# Patient Record
Sex: Female | Born: 1940 | Race: Black or African American | Hispanic: No | Smoking: Former smoker
Health system: Southern US, Community
[De-identification: ages and names within clinical notes are randomized; demographics above are authoritative.]

## PROBLEM LIST (undated history)

## (undated) DIAGNOSIS — H539 Unspecified visual disturbance: Secondary | ICD-10-CM

## (undated) DIAGNOSIS — R413 Other amnesia: Secondary | ICD-10-CM

## (undated) DIAGNOSIS — I2510 Atherosclerotic heart disease of native coronary artery without angina pectoris: Secondary | ICD-10-CM

## (undated) HISTORY — PX: VESICOVAGINAL FISTULA CLOSURE W/ TAH: SUR271

## (undated) HISTORY — DX: Unspecified visual disturbance: H53.9

## (undated) HISTORY — DX: Other amnesia: R41.3

## (undated) HISTORY — DX: Atherosclerotic heart disease of native coronary artery without angina pectoris: I25.10

---

## 1998-12-10 ENCOUNTER — Ambulatory Visit (HOSPITAL_COMMUNITY): Admission: RE | Admit: 1998-12-10 | Discharge: 1998-12-10 | Payer: Self-pay

## 2000-09-04 ENCOUNTER — Encounter (HOSPITAL_COMMUNITY): Admission: RE | Admit: 2000-09-04 | Discharge: 2000-12-03 | Payer: Self-pay | Admitting: Family Medicine

## 2001-04-12 ENCOUNTER — Ambulatory Visit (HOSPITAL_COMMUNITY): Admission: RE | Admit: 2001-04-12 | Discharge: 2001-04-12 | Payer: Self-pay | Admitting: Family Medicine

## 2001-04-12 ENCOUNTER — Encounter: Payer: Self-pay | Admitting: Family Medicine

## 2001-06-11 ENCOUNTER — Encounter: Payer: Self-pay | Admitting: Family Medicine

## 2001-06-11 ENCOUNTER — Ambulatory Visit (HOSPITAL_COMMUNITY): Admission: RE | Admit: 2001-06-11 | Discharge: 2001-06-11 | Payer: Self-pay | Admitting: Family Medicine

## 2003-01-05 ENCOUNTER — Ambulatory Visit (HOSPITAL_COMMUNITY): Admission: RE | Admit: 2003-01-05 | Discharge: 2003-01-05 | Payer: Self-pay | Admitting: Family Medicine

## 2004-01-26 ENCOUNTER — Ambulatory Visit: Payer: Self-pay | Admitting: *Deleted

## 2004-07-19 ENCOUNTER — Ambulatory Visit: Payer: Self-pay | Admitting: Family Medicine

## 2004-07-27 ENCOUNTER — Ambulatory Visit (HOSPITAL_COMMUNITY): Admission: RE | Admit: 2004-07-27 | Discharge: 2004-07-27 | Payer: Self-pay | Admitting: Family Medicine

## 2004-11-30 ENCOUNTER — Ambulatory Visit: Payer: Self-pay | Admitting: Family Medicine

## 2004-12-01 ENCOUNTER — Ambulatory Visit: Payer: Self-pay | Admitting: Family Medicine

## 2004-12-02 ENCOUNTER — Ambulatory Visit (HOSPITAL_COMMUNITY): Admission: RE | Admit: 2004-12-02 | Discharge: 2004-12-02 | Payer: Self-pay | Admitting: Family Medicine

## 2005-04-14 ENCOUNTER — Ambulatory Visit: Payer: Self-pay | Admitting: Nurse Practitioner

## 2005-05-15 ENCOUNTER — Ambulatory Visit: Payer: Self-pay | Admitting: Family Medicine

## 2005-11-03 ENCOUNTER — Ambulatory Visit: Payer: Self-pay | Admitting: Family Medicine

## 2005-12-04 ENCOUNTER — Ambulatory Visit: Payer: Self-pay | Admitting: Family Medicine

## 2007-08-26 ENCOUNTER — Emergency Department (HOSPITAL_COMMUNITY): Admission: EM | Admit: 2007-08-26 | Discharge: 2007-08-26 | Payer: Self-pay | Admitting: Emergency Medicine

## 2007-08-29 ENCOUNTER — Emergency Department (HOSPITAL_COMMUNITY): Admission: EM | Admit: 2007-08-29 | Discharge: 2007-08-29 | Payer: Self-pay | Admitting: Emergency Medicine

## 2009-06-11 ENCOUNTER — Inpatient Hospital Stay (HOSPITAL_COMMUNITY): Admission: EM | Admit: 2009-06-11 | Discharge: 2009-06-18 | Payer: Self-pay | Admitting: Emergency Medicine

## 2009-10-12 ENCOUNTER — Inpatient Hospital Stay (HOSPITAL_COMMUNITY): Admission: EM | Admit: 2009-10-12 | Discharge: 2009-10-14 | Payer: Self-pay | Admitting: Emergency Medicine

## 2010-06-04 LAB — CBC
HCT: 42.5 % (ref 36.0–46.0)
Hemoglobin: 10.8 g/dL — ABNORMAL LOW (ref 12.0–15.0)
Hemoglobin: 14.3 g/dL (ref 12.0–15.0)
MCH: 30.3 pg (ref 26.0–34.0)
MCH: 30.3 pg (ref 26.0–34.0)
MCHC: 32.9 g/dL (ref 30.0–36.0)
MCHC: 33.7 g/dL (ref 30.0–36.0)
MCV: 90.1 fL (ref 78.0–100.0)
Platelets: 325 10*3/uL (ref 150–400)
RBC: 3.47 MIL/uL — ABNORMAL LOW (ref 3.87–5.11)
RDW: 17.9 % — ABNORMAL HIGH (ref 11.5–15.5)
RDW: 17.9 % — ABNORMAL HIGH (ref 11.5–15.5)
RDW: 18.1 % — ABNORMAL HIGH (ref 11.5–15.5)

## 2010-06-04 LAB — BASIC METABOLIC PANEL
Chloride: 100 mEq/L (ref 96–112)
Creatinine, Ser: 1.36 mg/dL — ABNORMAL HIGH (ref 0.4–1.2)
GFR calc Af Amer: 47 mL/min — ABNORMAL LOW (ref >60)
Potassium: 4.1 mEq/L (ref 3.5–5.1)
Sodium: 132 mEq/L — ABNORMAL LOW (ref 135–145)

## 2010-06-04 LAB — URINALYSIS, ROUTINE W REFLEX MICROSCOPIC
Glucose, UA: NEGATIVE mg/dL
Hgb urine dipstick: NEGATIVE
Protein, ur: 30 mg/dL — AB
Urobilinogen, UA: 0.2 mg/dL (ref 0.0–1.0)
pH: 5 (ref 5.0–8.0)

## 2010-06-04 LAB — DIFFERENTIAL
Basophils Absolute: 0 10*3/uL (ref 0.0–0.1)
Blasts: 0 %
Eosinophils Absolute: 0 10*3/uL (ref 0.0–0.7)
Eosinophils Relative: 0 % (ref 0–5)
Lymphocytes Relative: 16 % (ref 12–46)
Lymphocytes Relative: 56 % — ABNORMAL HIGH (ref 12–46)
Lymphs Abs: 3.8 10*3/uL (ref 0.7–4.0)
Metamyelocytes Relative: 0 %
Monocytes Absolute: 0.5 10*3/uL (ref 0.1–1.0)
Monocytes Relative: 4 % (ref 3–12)
Monocytes Relative: 7 % (ref 3–12)
nRBC: 0 /100 WBC

## 2010-06-04 LAB — URINE MICROSCOPIC-ADD ON

## 2010-06-04 LAB — COMPREHENSIVE METABOLIC PANEL
ALT: 23 U/L (ref 0–35)
AST: 20 U/L (ref 0–37)
AST: 24 U/L (ref 0–37)
Albumin: 2.4 g/dL — ABNORMAL LOW (ref 3.5–5.2)
BUN: 17 mg/dL (ref 6–23)
Calcium: 8.3 mg/dL — ABNORMAL LOW (ref 8.4–10.5)
Calcium: 8.6 mg/dL (ref 8.4–10.5)
Chloride: 111 mEq/L (ref 96–112)
Creatinine, Ser: 0.64 mg/dL (ref 0.4–1.2)
Creatinine, Ser: 0.91 mg/dL (ref 0.4–1.2)
GFR calc Af Amer: >60 mL/min (ref >60)
Glucose, Bld: 60 mg/dL — ABNORMAL LOW (ref 70–99)
Glucose, Bld: 67 mg/dL — ABNORMAL LOW (ref 70–99)
Sodium: 140 mEq/L (ref 135–145)
Total Protein: 4.7 g/dL — ABNORMAL LOW (ref 6.0–8.3)

## 2010-06-04 LAB — TSH: TSH: 0.451 u[IU]/mL (ref 0.350–4.500)

## 2010-06-04 LAB — URINE CULTURE

## 2010-06-04 LAB — FERRITIN: Ferritin: 76 ng/mL (ref 10–291)

## 2010-06-04 LAB — POCT CARDIAC MARKERS
Myoglobin, poc: 152 ng/mL (ref 12–200)
Troponin i, poc: <0.05 ng/mL (ref 0.00–0.09)

## 2010-06-04 LAB — GLUCOSE, CAPILLARY: Glucose-Capillary: 82 mg/dL (ref 70–99)

## 2010-06-04 LAB — PREALBUMIN: Prealbumin: 28.7 mg/dL (ref 18.0–45.0)

## 2010-06-08 LAB — BASIC METABOLIC PANEL
BUN: 3 mg/dL — ABNORMAL LOW (ref 6–23)
CO2: 23 mEq/L (ref 19–32)
Calcium: 9.2 mg/dL (ref 8.4–10.5)
Chloride: 112 mEq/L (ref 96–112)
Creatinine, Ser: 0.63 mg/dL (ref 0.4–1.2)
GFR calc Af Amer: >60 mL/min (ref >60)
Glucose, Bld: 115 mg/dL — ABNORMAL HIGH (ref 70–99)

## 2010-06-13 LAB — CBC
HCT: 48.5 % — ABNORMAL HIGH (ref 36.0–46.0)
Hemoglobin: 11.4 g/dL — ABNORMAL LOW (ref 12.0–15.0)
MCHC: 33.4 g/dL (ref 30.0–36.0)
MCHC: 33.7 g/dL (ref 30.0–36.0)
MCHC: 33.8 g/dL (ref 30.0–36.0)
MCHC: 34.2 g/dL (ref 30.0–36.0)
MCV: 89.3 fL (ref 78.0–100.0)
MCV: 90.2 fL (ref 78.0–100.0)
MCV: 91.1 fL (ref 78.0–100.0)
Platelets: 219 10*3/uL (ref 150–400)
Platelets: 240 10*3/uL (ref 150–400)
Platelets: 287 10*3/uL (ref 150–400)
Platelets: 364 10*3/uL (ref 150–400)
RBC: 3.36 MIL/uL — ABNORMAL LOW (ref 3.87–5.11)
RBC: 3.48 MIL/uL — ABNORMAL LOW (ref 3.87–5.11)
RBC: 3.74 MIL/uL — ABNORMAL LOW (ref 3.87–5.11)
RDW: 15.9 % — ABNORMAL HIGH (ref 11.5–15.5)
RDW: 16 % — ABNORMAL HIGH (ref 11.5–15.5)
RDW: 16.1 % — ABNORMAL HIGH (ref 11.5–15.5)
WBC: 11.8 10*3/uL — ABNORMAL HIGH (ref 4.0–10.5)
WBC: 8.5 10*3/uL (ref 4.0–10.5)
WBC: 9.7 10*3/uL (ref 4.0–10.5)

## 2010-06-13 LAB — COMPREHENSIVE METABOLIC PANEL
ALT: 28 U/L (ref 0–35)
AST: 22 U/L (ref 0–37)
AST: 27 U/L (ref 0–37)
Albumin: 3.9 g/dL (ref 3.5–5.2)
Alkaline Phosphatase: 53 U/L (ref 39–117)
BUN: 68 mg/dL — ABNORMAL HIGH (ref 6–23)
CO2: 25 mEq/L (ref 19–32)
Calcium: 11.6 mg/dL — ABNORMAL HIGH (ref 8.4–10.5)
Calcium: 9.1 mg/dL (ref 8.4–10.5)
Chloride: 102 mEq/L (ref 96–112)
Chloride: 110 mEq/L (ref 96–112)
Creatinine, Ser: 1.69 mg/dL — ABNORMAL HIGH (ref 0.4–1.2)
GFR calc Af Amer: 36 mL/min — ABNORMAL LOW (ref >60)
GFR calc Af Amer: >60 mL/min (ref >60)
GFR calc non Af Amer: >60 mL/min (ref >60)
Glucose, Bld: 100 mg/dL — ABNORMAL HIGH (ref 70–99)
Sodium: 142 mEq/L (ref 135–145)
Total Bilirubin: 0.6 mg/dL (ref 0.3–1.2)
Total Bilirubin: 0.6 mg/dL (ref 0.3–1.2)

## 2010-06-13 LAB — PROTEIN ELECTROPH W RFLX QUANT IMMUNOGLOBULINS
Alpha-2-Globulin: 11.1 % (ref 7.1–11.8)
Gamma Globulin: 14.2 % (ref 11.1–18.8)
M-Spike, %: NOT DETECTED g/dL

## 2010-06-13 LAB — PHOSPHORUS: Phosphorus: 2 mg/dL — ABNORMAL LOW (ref 2.3–4.6)

## 2010-06-13 LAB — POCT I-STAT 3, ART BLOOD GAS (G3+)
Acid-base deficit: 9 mmol/L — ABNORMAL HIGH (ref 0.0–2.0)
O2 Saturation: 98 %
Patient temperature: 37
pH, Arterial: 7.346 — ABNORMAL LOW (ref 7.350–7.400)

## 2010-06-13 LAB — POCT CARDIAC MARKERS
CKMB, poc: 3.1 ng/mL (ref 1.0–8.0)
Troponin i, poc: <0.05 ng/mL (ref 0.00–0.09)

## 2010-06-13 LAB — BASIC METABOLIC PANEL
BUN: 19 mg/dL (ref 6–23)
BUN: 49 mg/dL — ABNORMAL HIGH (ref 6–23)
CO2: 16 mEq/L — ABNORMAL LOW (ref 19–32)
CO2: 17 mEq/L — ABNORMAL LOW (ref 19–32)
Calcium: 8.7 mg/dL (ref 8.4–10.5)
Calcium: 9.1 mg/dL (ref 8.4–10.5)
Calcium: 9.7 mg/dL (ref 8.4–10.5)
Creatinine, Ser: 0.6 mg/dL (ref 0.4–1.2)
Creatinine, Ser: 0.87 mg/dL (ref 0.4–1.2)
Creatinine, Ser: 1.2 mg/dL (ref 0.4–1.2)
GFR calc Af Amer: 54 mL/min — ABNORMAL LOW (ref >60)
GFR calc Af Amer: >60 mL/min (ref >60)
GFR calc Af Amer: >60 mL/min (ref >60)
GFR calc non Af Amer: >60 mL/min (ref >60)
Glucose, Bld: 101 mg/dL — ABNORMAL HIGH (ref 70–99)
Glucose, Bld: 81 mg/dL (ref 70–99)

## 2010-06-13 LAB — VITAMIN B12
Vitamin B-12: 707 pg/mL (ref 211–911)
Vitamin B-12: 777 pg/mL (ref 211–911)

## 2010-06-13 LAB — CULTURE, BLOOD (ROUTINE X 2)

## 2010-06-13 LAB — DIFFERENTIAL
Basophils Absolute: 0 10*3/uL (ref 0.0–0.1)
Basophils Absolute: 0 10*3/uL (ref 0.0–0.1)
Eosinophils Absolute: 0.2 10*3/uL (ref 0.0–0.7)
Eosinophils Relative: 0 % (ref 0–5)
Lymphocytes Relative: 33 % (ref 12–46)
Lymphocytes Relative: 35 % (ref 12–46)
Lymphs Abs: 3 10*3/uL (ref 0.7–4.0)
Monocytes Absolute: 0.7 10*3/uL (ref 0.1–1.0)
Monocytes Absolute: 1.2 10*3/uL — ABNORMAL HIGH (ref 0.1–1.0)
Neutro Abs: 4.8 10*3/uL (ref 1.7–7.7)
Neutrophils Relative %: 55 % (ref 43–77)

## 2010-06-13 LAB — RPR: RPR Ser Ql: NONREACTIVE

## 2010-06-13 LAB — FOLATE RBC: RBC Folate: 334 ng/mL (ref 180–600)

## 2010-06-13 LAB — URINALYSIS, ROUTINE W REFLEX MICROSCOPIC
Hgb urine dipstick: NEGATIVE
Protein, ur: NEGATIVE mg/dL
Urobilinogen, UA: 0.2 mg/dL (ref 0.0–1.0)

## 2010-06-13 LAB — PTH, INTACT AND CALCIUM: Calcium, Total (PTH): 10.5 mg/dL (ref 8.4–10.5)

## 2010-06-13 LAB — HEMOCCULT GUIAC POC 1CARD (OFFICE): Fecal Occult Bld: NEGATIVE

## 2010-06-13 LAB — TROPONIN I: Troponin I: 0.01 ng/mL (ref 0.00–0.06)

## 2010-06-13 LAB — IRON AND TIBC
Iron: 61 ug/dL (ref 42–135)
Saturation Ratios: 35 % (ref 20–55)
UIBC: 111 ug/dL

## 2010-06-13 LAB — RETICULOCYTES: RBC.: 3.32 MIL/uL — ABNORMAL LOW (ref 3.87–5.11)

## 2010-06-13 LAB — AMMONIA: Ammonia: 33 umol/L (ref 11–35)

## 2010-06-13 LAB — CK TOTAL AND CKMB (NOT AT ARMC): Total CK: 35 U/L (ref 7–177)

## 2010-12-15 LAB — CBC
HCT: 44.8
Hemoglobin: 15
MCHC: 33.6
MCV: 87.5
Platelets: 266
RBC: 5.12 — ABNORMAL HIGH
RDW: 14.1
WBC: 6.3

## 2010-12-15 LAB — DIFFERENTIAL
Basophils Absolute: 0
Basophils Relative: 0
Eosinophils Absolute: 0.1
Eosinophils Relative: 1
Lymphocytes Relative: 27
Lymphs Abs: 1.7
Monocytes Absolute: 0.5
Monocytes Relative: 7
Neutro Abs: 4
Neutrophils Relative %: 64

## 2010-12-15 LAB — BASIC METABOLIC PANEL
BUN: 10
Calcium: 9.6
Creatinine, Ser: 1.14
GFR calc non Af Amer: 48 — ABNORMAL LOW
Glucose, Bld: 86
Potassium: 4.5

## 2010-12-15 LAB — BASIC METABOLIC PANEL WITH GFR
CO2: 27
Chloride: 104
GFR calc Af Amer: 58 — ABNORMAL LOW
Sodium: 139

## 2014-01-17 ENCOUNTER — Emergency Department (HOSPITAL_COMMUNITY): Payer: Medicare Other

## 2014-01-17 ENCOUNTER — Encounter (HOSPITAL_COMMUNITY): Payer: Self-pay | Admitting: Emergency Medicine

## 2014-01-17 ENCOUNTER — Emergency Department (HOSPITAL_COMMUNITY)
Admission: EM | Admit: 2014-01-17 | Discharge: 2014-01-17 | Disposition: A | Discharge status: Home / Self Care | Payer: Medicare Other | Attending: Emergency Medicine | Admitting: Emergency Medicine

## 2014-01-17 DIAGNOSIS — Z87891 Personal history of nicotine dependence: Secondary | ICD-10-CM | POA: Insufficient documentation

## 2014-01-17 DIAGNOSIS — R1084 Generalized abdominal pain: Secondary | ICD-10-CM | POA: Diagnosis not present

## 2014-01-17 DIAGNOSIS — K7689 Other specified diseases of liver: Secondary | ICD-10-CM | POA: Diagnosis not present

## 2014-01-17 DIAGNOSIS — R1033 Periumbilical pain: Secondary | ICD-10-CM | POA: Diagnosis not present

## 2014-01-17 DIAGNOSIS — F0390 Unspecified dementia without behavioral disturbance: Secondary | ICD-10-CM | POA: Diagnosis not present

## 2014-01-17 DIAGNOSIS — R51 Headache: Secondary | ICD-10-CM | POA: Insufficient documentation

## 2014-01-17 DIAGNOSIS — E860 Dehydration: Secondary | ICD-10-CM | POA: Diagnosis not present

## 2014-01-17 DIAGNOSIS — R519 Headache, unspecified: Secondary | ICD-10-CM

## 2014-01-17 DIAGNOSIS — R1030 Lower abdominal pain, unspecified: Secondary | ICD-10-CM | POA: Diagnosis not present

## 2014-01-17 LAB — CBC WITH DIFFERENTIAL/PLATELET
BASOS PCT: 0 % (ref 0–1)
Basophils Absolute: 0 10*3/uL (ref 0.0–0.1)
EOS ABS: 0 10*3/uL (ref 0.0–0.7)
Eosinophils Relative: 1 % (ref 0–5)
HEMATOCRIT: 32.1 % — AB (ref 36.0–46.0)
Hemoglobin: 10.8 g/dL — ABNORMAL LOW (ref 12.0–15.0)
Lymphocytes Relative: 31 % (ref 12–46)
Lymphs Abs: 1.7 10*3/uL (ref 0.7–4.0)
MCH: 32 pg (ref 26.0–34.0)
MCHC: 33.6 g/dL (ref 30.0–36.0)
MCV: 95.3 fL (ref 78.0–100.0)
MONO ABS: 0.7 10*3/uL (ref 0.1–1.0)
MONOS PCT: 14 % — AB (ref 3–12)
Neutro Abs: 3 10*3/uL (ref 1.7–7.7)
Neutrophils Relative %: 54 % (ref 43–77)
Platelets: 416 10*3/uL — ABNORMAL HIGH (ref 150–400)
RBC: 3.37 MIL/uL — ABNORMAL LOW (ref 3.87–5.11)
RDW: 19.1 % — ABNORMAL HIGH (ref 11.5–15.5)
WBC: 5.5 10*3/uL (ref 4.0–10.5)

## 2014-01-17 LAB — URINALYSIS, ROUTINE W REFLEX MICROSCOPIC
Glucose, UA: NEGATIVE mg/dL
Hgb urine dipstick: NEGATIVE
Ketones, ur: >80 mg/dL — AB
Leukocytes, UA: NEGATIVE
Nitrite: NEGATIVE
Protein, ur: 100 mg/dL — AB
SPECIFIC GRAVITY, URINE: 1.022 (ref 1.005–1.030)
Urobilinogen, UA: 1 mg/dL (ref 0.0–1.0)
pH: 6 (ref 5.0–8.0)

## 2014-01-17 LAB — URINE MICROSCOPIC-ADD ON

## 2014-01-17 LAB — COMPREHENSIVE METABOLIC PANEL
ALT: 10 U/L (ref 0–35)
ANION GAP: 21 — AB (ref 5–15)
AST: 29 U/L (ref 0–37)
Albumin: 3.7 g/dL (ref 3.5–5.2)
Alkaline Phosphatase: 49 U/L (ref 39–117)
BILIRUBIN TOTAL: 0.4 mg/dL (ref 0.3–1.2)
BUN: 13 mg/dL (ref 6–23)
CO2: 21 mEq/L (ref 19–32)
CREATININE: 0.71 mg/dL (ref 0.50–1.10)
Calcium: 9.9 mg/dL (ref 8.4–10.5)
Chloride: 101 mEq/L (ref 96–112)
GFR calc Af Amer: >90 mL/min (ref >90)
GFR calc non Af Amer: 84 mL/min — ABNORMAL LOW (ref >90)
Glucose, Bld: 83 mg/dL (ref 70–99)
Potassium: 3.1 mEq/L — ABNORMAL LOW (ref 3.7–5.3)
Sodium: 143 mEq/L (ref 137–147)
TOTAL PROTEIN: 6.9 g/dL (ref 6.0–8.3)

## 2014-01-17 LAB — LIPASE, BLOOD: LIPASE: 39 U/L (ref 11–59)

## 2014-01-17 MED ORDER — SODIUM CHLORIDE 0.9 % IV BOLUS (SEPSIS)
500.0000 mL | Freq: Once | INTRAVENOUS | Status: AC
  Administered 2014-01-17: 500 mL via INTRAVENOUS

## 2014-01-17 MED ORDER — IOHEXOL 300 MG/ML  SOLN
100.0000 mL | Freq: Once | INTRAMUSCULAR | Status: AC | PRN
  Administered 2014-01-17: 100 mL via INTRAVENOUS

## 2014-01-17 MED ORDER — ACETAMINOPHEN 325 MG PO TABS
650.0000 mg | ORAL_TABLET | Freq: Once | ORAL | Status: AC
  Administered 2014-01-17: 650 mg via ORAL
  Filled 2014-01-17: qty 2

## 2014-01-17 MED ORDER — MORPHINE SULFATE 4 MG/ML IJ SOLN
4.0000 mg | Freq: Once | INTRAMUSCULAR | Status: AC
  Administered 2014-01-17: 4 mg via INTRAVENOUS
  Filled 2014-01-17: qty 1

## 2014-01-17 MED ORDER — TRAMADOL HCL 50 MG PO TABS: 50.0000 mg | ORAL_TABLET | Freq: Four times a day (QID) | ORAL | Status: DC | PRN

## 2014-01-17 MED ORDER — GI COCKTAIL ~~LOC~~
30.0000 mL | Freq: Once | ORAL | Status: AC
  Administered 2014-01-17: 30 mL via ORAL
  Filled 2014-01-17: qty 30

## 2014-01-17 MED ORDER — IOHEXOL 300 MG/ML  SOLN
50.0000 mL | Freq: Once | INTRAMUSCULAR | Status: AC | PRN
  Administered 2014-01-17: 50 mL via ORAL

## 2014-01-17 MED ORDER — SODIUM CHLORIDE 0.9 % IV BOLUS (SEPSIS)
1000.0000 mL | INTRAVENOUS | Status: AC
  Administered 2014-01-17: 1000 mL via INTRAVENOUS

## 2014-01-17 MED ORDER — FENTANYL CITRATE 0.05 MG/ML IJ SOLN
50.0000 ug | Freq: Once | INTRAMUSCULAR | Status: AC
  Administered 2014-01-17: 50 ug via INTRAVENOUS
  Filled 2014-01-17: qty 2

## 2014-01-17 NOTE — Discharge Instructions (Signed)
Abdominal Pain, Women °Abdominal (stomach, pelvic, or belly) pain can be caused by many things. It is important to tell your doctor: °· The location of the pain. °· Does it come and go or is it present all the time? °· Are there things that start the pain (eating certain foods, exercise)? °· Are there other symptoms associated with the pain (fever, nausea, vomiting, diarrhea)? °All of this is helpful to know when trying to find the cause of the pain. °CAUSES  °· Stomach: virus or bacteria infection, or ulcer. °· Intestine: appendicitis (inflamed appendix), regional ileitis (Crohn's disease), ulcerative colitis (inflamed colon), irritable bowel syndrome, diverticulitis (inflamed diverticulum of the colon), or cancer of the stomach or intestine. °· Gallbladder disease or stones in the gallbladder. °· Kidney disease, kidney stones, or infection. °· Pancreas infection or cancer. °· Fibromyalgia (pain disorder). °· Diseases of the female organs: °¨ Uterus: fibroid (non-cancerous) tumors or infection. °¨ Fallopian tubes: infection or tubal pregnancy. °¨ Ovary: cysts or tumors. °¨ Pelvic adhesions (scar tissue). °¨ Endometriosis (uterus lining tissue growing in the pelvis and on the pelvic organs). °¨ Pelvic congestion syndrome (female organs filling up with blood just before the menstrual period). °¨ Pain with the menstrual period. °¨ Pain with ovulation (producing an egg). °¨ Pain with an IUD (intrauterine device, birth control) in the uterus. °¨ Cancer of the female organs. °· Functional pain (pain not caused by a disease, may improve without treatment). °· Psychological pain. °· Depression. °DIAGNOSIS  °Your doctor will decide the seriousness of your pain by doing an examination. °· Blood tests. °· X-rays. °· Ultrasound. °· CT scan (computed tomography, special type of X-ray). °· MRI (magnetic resonance imaging). °· Cultures, for infection. °· Barium enema (dye inserted in the large intestine, to better view it with  X-rays). °· Colonoscopy (looking in intestine with a lighted tube). °· Laparoscopy (minor surgery, looking in abdomen with a lighted tube). °· Major abdominal exploratory surgery (looking in abdomen with a large incision). °TREATMENT  °The treatment will depend on the cause of the pain.  °· Many cases can be observed and treated at home. °· Over-the-counter medicines recommended by your caregiver. °· Prescription medicine. °· Antibiotics, for infection. °· Birth control pills, for painful periods or for ovulation pain. °· Hormone treatment, for endometriosis. °· Nerve blocking injections. °· Physical therapy. °· Antidepressants. °· Counseling with a psychologist or psychiatrist. °· Minor or major surgery. °HOME CARE INSTRUCTIONS  °· Do not take laxatives, unless directed by your caregiver. °· Take over-the-counter pain medicine only if ordered by your caregiver. Do not take aspirin because it can cause an upset stomach or bleeding. °· Try a clear liquid diet (broth or water) as ordered by your caregiver. Slowly move to a bland diet, as tolerated, if the pain is related to the stomach or intestine. °· Have a thermometer and take your temperature several times a day, and record it. °· Bed rest and sleep, if it helps the pain. °· Avoid sexual intercourse, if it causes pain. °· Avoid stressful situations. °· Keep your follow-up appointments and tests, as your caregiver orders. °· If the pain does not go away with medicine or surgery, you may try: °¨ Acupuncture. °¨ Relaxation exercises (yoga, meditation). °¨ Group therapy. °¨ Counseling. °SEEK MEDICAL CARE IF:  °· You notice certain foods cause stomach pain. °· Your home care treatment is not helping your pain. °· You need stronger pain medicine. °· You want your IUD removed. °· You feel faint or   lightheaded. °· You develop nausea and vomiting. °· You develop a rash. °· You are having side effects or an allergy to your medicine. °SEEK IMMEDIATE MEDICAL CARE IF:  °· Your  pain does not go away or gets worse. °· You have a fever. °· Your pain is felt only in portions of the abdomen. The right side could possibly be appendicitis. The left lower portion of the abdomen could be colitis or diverticulitis. °· You are passing blood in your stools (bright red or black tarry stools, with or without vomiting). °· You have blood in your urine. °· You develop chills, with or without a fever. °· You pass out. °MAKE SURE YOU:  °· Understand these instructions. °· Will watch your condition. °· Will get help right away if you are not doing well or get worse. °Document Released: 01/01/2007 Document Revised: 07/21/2013 Document Reviewed: 01/21/2009 °ExitCare® Patient Information ©2015 [employer]. This information is not intended to replace advice given to you by your health care provider. Make sure you discuss any questions you have with your health care provider. ° °

## 2014-01-17 NOTE — ED Notes (Signed)
Social work/case management called.

## 2014-01-17 NOTE — ED Notes (Signed)
MD Aline Brochure in to speak with family member who is Therapist, sports and also niece for clarification of discharge orders and plan of care.

## 2014-01-17 NOTE — ED Notes (Addendum)
Pt from home via [facility] c/o lower abdominal pain and headache for "a few days". Per [facility] pt does  Not see a doctor and has had significant weight loss. She has not been eating. Per EMS family is concerned for patient being able to care for herself (she lives alone). PT is tearful during assessment. She denies nausea or vomiting.

## 2014-01-17 NOTE — ED Notes (Addendum)
Pt removed 20 G IV in left AC that was placed by [place]. She was informed that she will have to have an IV for CT. Niece at bedside and reports pt pulled IV out.

## 2014-01-17 NOTE — ED Notes (Signed)
IV infiltrated. Will have ultrasound IV attempt.

## 2014-01-17 NOTE — ED Notes (Signed)
Bed: WA01 Expected date: 01/17/14 Expected time: 4:04 PM Means of arrival: Ambulance Comments: headache

## 2014-01-17 NOTE — ED Provider Notes (Signed)
CSN: [id number]     Arrival date & time 01/17/14  1609 History   First MD Initiated Contact with Patient 01/17/14 1648     Chief Complaint  Patient presents with  . Headache  . Abdominal Pain     (Consider location/radiation/quality/duration/timing/severity/associated sxs/prior Treatment) Patient is a 73 y.o. female presenting with headaches and abdominal pain. The history is provided by the patient.  Headache Pain location:  Frontal Quality:  Dull Radiates to:  Does not radiate Severity currently:  7/10 Severity at highest:  Unable to specify Onset quality:  Gradual Duration:  3 weeks Timing:  Intermittent Progression:  Waxing and waning Chronicity:  Recurrent Similar to prior headaches: yes   Context comment:  Spontaneously Relieved by:  Nothing Worsened by:  Nothing tried Ineffective treatments:  NSAIDs and aspirin Associated symptoms: abdominal pain   Associated symptoms: no back pain, no congestion, no cough, no diarrhea, no dizziness, no pain, no fatigue, no fever, no nausea, no neck pain and no vomiting   Abdominal pain:    Location:  Periumbilical   Quality:  Unable to specify   Severity:  Mild   Onset quality:  Gradual   Duration:  4 days   Timing:  Intermittent   Progression:  Worsening   Chronicity:  Recurrent Abdominal Pain Associated symptoms: no chest pain, no cough, no diarrhea, no dysuria, no fatigue, no fever, no hematuria, no nausea, no shortness of breath and no vomiting     History reviewed. No pertinent past medical history. History reviewed. No pertinent past surgical history. No family history on file. History  Substance Use Topics  . Smoking status: Former Smoker    Types: Cigarettes  . Smokeless tobacco: Not on file  . Alcohol Use: No   OB History   Grav Para Term Preterm Abortions TAB SAB Ect Mult Living                 Review of Systems  Constitutional: Negative for fever and fatigue.  HENT: Negative for congestion and drooling.    Eyes: Negative for pain.  Respiratory: Negative for cough and shortness of breath.   Cardiovascular: Negative for chest pain.  Gastrointestinal: Positive for abdominal pain. Negative for nausea, vomiting and diarrhea.  Genitourinary: Negative for dysuria and hematuria.  Musculoskeletal: Negative for back pain, gait problem and neck pain.  Skin: Negative for color change.  Neurological: Positive for headaches. Negative for dizziness.  Hematological: Negative for adenopathy.  Psychiatric/Behavioral: Negative for behavioral problems.  All other systems reviewed and are negative.     Allergies  Review of patient's allergies indicates no known allergies.  Home Medications   Prior to Admission medications   Not on File   BP 118/58  Pulse 78  Temp(Src) 98.1 F (36.7 C) (Oral)  Resp 18  SpO2 100% Physical Exam  Nursing note and vitals reviewed. Constitutional: She appears well-developed and well-nourished.  HENT:  Head: Normocephalic and atraumatic.  Mouth/Throat: Oropharynx is clear and moist. No oropharyngeal exudate.  Eyes: Conjunctivae and EOM are normal. Pupils are equal, round, and reactive to light.  Neck: Normal range of motion. Neck supple.  Cardiovascular: Normal rate, regular rhythm, normal heart sounds and intact distal pulses.  Exam reveals no gallop and no friction rub.   No murmur heard. Pulmonary/Chest: Effort normal and breath sounds normal. No respiratory distress. She has no wheezes.  Abdominal: Soft. Bowel sounds are normal. There is tenderness (mild periumbilical tenderness to palpation.). There is no rebound and no guarding.  Musculoskeletal: Normal range of motion. She exhibits no edema and no tenderness.  Neurological: She is alert.  alert, oriented x2, does not know year speech: normal in context and clarity memory: intact grossly cranial nerves II-XII: intact motor strength: full proximally and distally no involuntary movements or  tremors sensation: intact to light touch diffusely  cerebellar: finger-to-nose and heel-to-shin intact gait: normal forwards and backwards   Skin: Skin is warm and dry.  Psychiatric: She has a normal mood and affect. Her behavior is normal.    ED Course  Procedures (including critical care time) Labs Review Labs Reviewed  CBC WITH DIFFERENTIAL - Abnormal; Notable for the following:    RBC 3.37 (*)    Hemoglobin 10.8 (*)    HCT 32.1 (*)    RDW 19.1 (*)    Platelets 416 (*)    Monocytes Relative 14 (*)    All other components within normal limits  COMPREHENSIVE METABOLIC PANEL - Abnormal; Notable for the following:    Potassium 3.1 (*)    GFR calc non Af Amer 84 (*)    Anion gap 21 (*)    All other components within normal limits  URINALYSIS, ROUTINE W REFLEX MICROSCOPIC - Abnormal; Notable for the following:    APPearance CLOUDY (*)    Bilirubin Urine LARGE (*)    Ketones, ur >80 (*)    Protein, ur 100 (*)    All other components within normal limits  LIPASE, BLOOD  URINE MICROSCOPIC-ADD ON    Imaging Review Ct Head Wo Contrast  01/17/2014   CLINICAL DATA:  Headache for several days.  Dementia.  EXAM: CT HEAD WITHOUT CONTRAST  TECHNIQUE: Contiguous axial images were obtained from the base of the skull through the vertex without intravenous contrast.  COMPARISON:  October 12, 2009  FINDINGS: There is mild diffuse atrophy. There is no mass, hemorrhage, extra-axial fluid collection, or midline shift. The gray-white compartments appear normal. No acute infarct apparent. Bony calvarium appears intact. The mastoid air cells are clear.  IMPRESSION: Mild diffuse atrophy. Study otherwise unremarkable. No intracranial mass, hemorrhage, or evidence of acute infarct.   Electronically Signed   By: Lowella Grip M.D.   On: 01/17/2014 20:41   Ct Abdomen Pelvis W Contrast  01/17/2014   CLINICAL DATA:  Lower abdominal pain and headache for a few days. Weakness.  EXAM: CT ABDOMEN AND PELVIS  WITH CONTRAST  TECHNIQUE: Multidetector CT imaging of the abdomen and pelvis was performed using the standard protocol following bolus administration of intravenous contrast.  CONTRAST:  158mL OMNIPAQUE IOHEXOL 300 MG/ML SOLN, 17mL OMNIPAQUE IOHEXOL 300 MG/ML SOLN  COMPARISON:  06/11/2009  FINDINGS: Lower chest: No pleural or pericardial effusion identified. The lung bases appear clear.  Hepatobiliary: Small cyst within the left hepatic lobe is stable measuring 8 mm. No suspicious liver abnormality. The gallbladder appears normal. No biliary dilatation.  Pancreas: The pancreatic duct is increased in caliber measuring 6 mm within the tail of pancreas. This is new when compared with study from 06/11/2009. No mass identified. Pancreas divisum anatomy is suspected.  Spleen: Normal appearance of the spleen.  Adrenals/Urinary Tract: The adrenal glands are both normal 4 mm nonobstructing stone within the upper pole the right kidney is noted. The left kidney is normal. The urinary bladder appears within normal limits. The urinary bladder appears normal.  Stomach/Bowel: The stomach appears normal. The small bowel loops have a normal course and caliber. There is no obstruction.  Vascular/Lymphatic: There is calcified atherosclerotic disease  involving the abdominal aorta. No aneurysm. There is no retroperitoneal adenopathy. No mesenteric adenopathy. No pelvic or inguinal adenopathy.  Reproductive: Previous hysterectomy.  No adnexal mass noted.  Other: No free fluid or abnormal fluid collection identified within the abdomen or pelvis.  Musculoskeletal: Review of the visualized bony structures is significant for mild scoliosis and multi level degenerative disc disease. This is most advanced at the L5-S1 level.  IMPRESSION: 1. No acute findings identified within the abdomen or pelvis. 2. Since the previous exam there has been interval increase in caliber of the pancreatic duct which now measures 6 mm. Pancreas divisum anatomy is  suspected. Although no mass is identified, further evaluation with nonemergent, contrast enhanced MRCP is recommended. 3. Lumbar spondylosis.   Electronically Signed   By: Kerby Moors M.D.   On: 01/17/2014 20:58     EKG Interpretation None      MDM   Final diagnoses:  Headache  Periumbilical pain  Dehydration    5:26 PM 73 y.o. female  Who presents with intermittent headaches over the last 3 weeks and intermittent abdominal pain over the last 3-4 days. She denies any fevers, vomiting, or diarrhea. She is afebrile and vital signs are unremarkable here. Headache is consistent with previous headaches in the past. She is here with another family member. The patient lives alone. Will get screening labs and imaging. She has a normal neurologic exam.  10:20 PM: I interpreted/reviewed the labs and/or imaging. Lg ketones in the UA, also elev A gap but no acidosis. Possibly related to dehydration and poor nutrition (starvation ketosis?). I educated the pt and family about this. She got 1.5 L of IVF here and ate a sandwich. I also ordered a face to face as she lives alone and likely needs some more support at home. Care mgmt was not here to help facilitate this given that it was the weekend.  She was feeling much better and was d/c home.  I have discussed the diagnosis/risks/treatment options with the patient and family and believe the pt to be eligible for discharge home to follow-up with her pcp. We also discussed returning to the ED immediately if new or worsening sx occur. We discussed the sx which are most concerning (e.g., worsening pain, fever) that necessitate immediate return. Medications administered to the patient during their visit and any new prescriptions provided to the patient are listed below.  Medications given during this visit Medications  sodium chloride 0.9 % bolus 500 mL (0 mLs Intravenous Stopped 01/17/14 2030)  fentaNYL (SUBLIMAZE) injection 50 mcg (50 mcg Intravenous Given  01/17/14 1823)  acetaminophen (TYLENOL) tablet 650 mg (650 mg Oral Given 01/17/14 1744)  gi cocktail (Maalox,Lidocaine,Donnatal) (30 mLs Oral Given 01/17/14 1946)  morphine 4 MG/ML injection 4 mg (4 mg Intravenous Given 01/17/14 1946)  iohexol (OMNIPAQUE) 300 MG/ML solution 50 mL (50 mLs Oral Contrast Given 01/17/14 2016)  iohexol (OMNIPAQUE) 300 MG/ML solution 100 mL (100 mLs Intravenous Contrast Given 01/17/14 2016)  sodium chloride 0.9 % bolus 1,000 mL (0 mLs Intravenous Stopped 01/17/14 2213)    New Prescriptions   TRAMADOL (ULTRAM) 50 MG TABLET    Take 1 tablet (50 mg total) by mouth every 6 (six) hours as needed.     Pamella Pert, MD 01/18/14 [phone]

## 2014-01-17 NOTE — ED Notes (Signed)
Pt does not wish to try for UA.  Will try for ask about UA again.

## 2014-01-18 NOTE — Progress Notes (Signed)
CARE MANAGEMENT NOTE 01/18/2014  Patient:  Audrey Norris, Audrey Norris   Account Number:  [id number]  Date Initiated:  01/18/2014  Documentation initiated by:  [facility]  Subjective/Objective Assessment:   dehydration     Action/Plan:   lives alone   Anticipated DC Date:  01/17/2014   Anticipated DC Plan:  [place]  CM consult      Choice offered to / List presented to:             Status of service:  Completed, signed off Medicare Important Message given?   (If response is "NO", the following Medicare IM given date fields will be blank) Date Medicare IM given:   Medicare IM given by:   Date Additional Medicare IM given:   Additional Medicare IM given by:    Discharge Disposition:  [place]  Per UR Regulation:    If discussed at Long Length of Stay Meetings, dates discussed:    Comments:  01/18/2014 0920 Attempted call to number listed # [phone] number not accepting any calls at this time. HH orders were placed in system. Pt dc home 01/17/2014 at 11:14 pm from ED. Jonnie Finner RN CCM Case Mgmt phone [phone]

## 2014-01-18 NOTE — Progress Notes (Signed)
CARE MANAGEMENT NOTE 01/18/2014  Patient:  Audrey Norris, Audrey Norris   Account Number:  [id number]  Date Initiated:  01/18/2014  Documentation initiated by:  [facility]  Subjective/Objective Assessment:   dehydration     Action/Plan:   lives alone   Anticipated DC Date:  01/17/2014   Anticipated DC Plan:  [place]  CM consult      Choice offered to / List presented to:             Status of service:  Completed, signed off Medicare Important Message given?   (If response is "NO", the following Medicare IM given date fields will be blank) Date Medicare IM given:   Medicare IM given by:   Date Additional Medicare IM given:   Additional Medicare IM given by:    Discharge Disposition:  [place]  Per UR Regulation:    If discussed at Long Length of Stay Meetings, dates discussed:    Comments:  01/18/2014 1445 Attempted call to number and not accepting calls. No emergency contact number to call. Unable to arrange [facility]. Jonnie Finner RN CCM Case Mgmt phone [phone]  01/18/2014 0920 Attempted call to number listed # [phone] number not accepting any calls at this time. HH orders were placed in system. Pt dc home 01/17/2014 at 11:14 pm from ED. Jonnie Finner RN CCM Case Mgmt phone [phone]

## 2014-01-19 NOTE — Progress Notes (Signed)
CARE MANAGEMENT Audrey NOTE 01/19/2014  Patient:  Audrey Norris, Audrey Norris   Account Number:  [id number]  Date Initiated:  01/19/2014  Documentation initiated by:  Audrey Norris  Subjective/Objective Assessment:   73 yr old medicare [place] pt seen in [place] Audrey for Headache and abdominal pain on 01/17/14 found to have dehydration and poor nutrition     Subjective/Objective Assessment Detail:   Audrey Norris pt's niece [phone] cell # & [phone] home #    In [facility] 01/17/14 Dehydration requested more support at home    Lives alone but has support from [place]     Action/Plan:   Audrey CM received a voice message left by Audrey Norris, pt's niece CM returned a call to [place] at [phone] cell # and left a voice message to include [facility] & CM office numbers   Action/Plan Detail:   CM requested a call from niece if home health staff has not arrived   Anticipated DC Date:  01/17/2014     Status Recommendation to Physician:   Result of Recommendation:    Other Audrey Norris  Other  Outpatient Services - Pt will follow up   [place]   Choice offered to / List presented to:  C-2 HC POA / Guardian     HH arranged  HH-1 RN  [place]      [place].    Status of service:  Completed, signed off  Audrey Comments:   Audrey Comments Detail:  01/19/14 1659 updated Audrey Norris on calls to [facility] and [facility] with responses- pt can be seen by [facility] with medicare part A, need for pt to get to [facility] to be seen so further orders can be provided & neurology referral for memory issues can be made. Audrey Norris states [facility] has already called.  Provided Audrey Norris number for [employer]. Encouraged Audrey Norris to inquire about HHSW from [facility] once medical care re established to help with medicaid application, meals on wheels,  community resources, placement etc.  Audrey Norris very appreciative of  services rendered 1622 Audrey Cm called [facility] and [place] Staff reports pt was a "No show for her last two appointments"  last 11/25/13 & 12/25/13 and has not been seen " in last year and two months" " She would have to be seen here" per Joellen Jersey for Home health orders to be signed off Joellen Jersey confirmed pt noted to have member issues in [place] chart (possible reason no show for appointments) 1521 Return call from [place] of [facility] Referral for care provided Reports pt with part A is able to have home health Request calls to Niece not pt 85 Left voice message fro Cyril Mourning of [facility] to provide referral for home health services Request calls to Niece at [phone] cell # & [phone] home # vs pt (who per niece has "not documented dementia" and behavioral issues 1500 Cm called Niece on her home number [phone] after not receiving a return call from her Nieces clarifies with CM pt concerns Reports pt d/c without home health services after 5 pm on 01/17/14 While speaking with Niece CM noted 2 attempts in EPIC by Weekend CM to contact pt at her home number listed as [phone] on 01/18/14 without success.  CM discussed this with Audrey Norris who  confirms there is not an operating number for pt and she had asked [facility] nursing staff to call her at either of her numbers and to remove this incorrect number for pt CM  to removed incorrect number in EPIC and added niece as contact person for pt Discussed DSS, APS with Audrey Norris who reports pt is no show for appointments and has walked out of appointments with DSS with Audrey Norris present.  Audrey Norris reports APS called x 2 for poor living conditions but pt found competent Discussed importance of medicare part B & D for pt Audrey Norris states pt reports calling medicare to have funds from being removed for Medicare part B and pt does not have medicaid because pt walked out of DSS stating she did not need services Discussed possible out of pocket expense for home health and other services  Audrey Norris reports previously working at [employer]

## 2014-01-20 DIAGNOSIS — F0390 Unspecified dementia without behavioral disturbance: Secondary | ICD-10-CM | POA: Diagnosis not present

## 2014-01-20 DIAGNOSIS — R627 Adult failure to thrive: Secondary | ICD-10-CM | POA: Diagnosis not present

## 2014-01-20 DIAGNOSIS — E46 Unspecified protein-calorie malnutrition: Secondary | ICD-10-CM | POA: Diagnosis not present

## 2014-01-20 DIAGNOSIS — R634 Abnormal weight loss: Secondary | ICD-10-CM | POA: Diagnosis not present

## 2014-01-22 DIAGNOSIS — E46 Unspecified protein-calorie malnutrition: Secondary | ICD-10-CM | POA: Diagnosis not present

## 2014-01-22 DIAGNOSIS — R634 Abnormal weight loss: Secondary | ICD-10-CM | POA: Diagnosis not present

## 2014-01-22 DIAGNOSIS — F0390 Unspecified dementia without behavioral disturbance: Secondary | ICD-10-CM | POA: Diagnosis not present

## 2014-01-22 DIAGNOSIS — R627 Adult failure to thrive: Secondary | ICD-10-CM | POA: Diagnosis not present

## 2014-01-23 NOTE — Progress Notes (Signed)
[facility] ED CM called [facility] to provided clinicals for pt last visit after sister was encouraged to make an appointment with Jinny Blossom so pt could be seen by [facility].  Bluford Main confirmed with CM that pt does not have an upcoming appointment to be seen but had to previously scheduled appointments to be seen & no showed for those 2 appointments.  CM did not send ED clinicals r/t to no upcoming pcp appointment scheduled

## 2014-01-27 DIAGNOSIS — R634 Abnormal weight loss: Secondary | ICD-10-CM | POA: Diagnosis not present

## 2014-01-27 DIAGNOSIS — F0390 Unspecified dementia without behavioral disturbance: Secondary | ICD-10-CM | POA: Diagnosis not present

## 2014-01-27 DIAGNOSIS — E46 Unspecified protein-calorie malnutrition: Secondary | ICD-10-CM | POA: Diagnosis not present

## 2014-01-27 DIAGNOSIS — R627 Adult failure to thrive: Secondary | ICD-10-CM | POA: Diagnosis not present

## 2014-01-30 DIAGNOSIS — F0390 Unspecified dementia without behavioral disturbance: Secondary | ICD-10-CM | POA: Diagnosis not present

## 2014-01-30 DIAGNOSIS — R634 Abnormal weight loss: Secondary | ICD-10-CM | POA: Diagnosis not present

## 2014-01-30 DIAGNOSIS — R627 Adult failure to thrive: Secondary | ICD-10-CM | POA: Diagnosis not present

## 2014-01-30 DIAGNOSIS — E46 Unspecified protein-calorie malnutrition: Secondary | ICD-10-CM | POA: Diagnosis not present

## 2014-02-02 DIAGNOSIS — F0390 Unspecified dementia without behavioral disturbance: Secondary | ICD-10-CM | POA: Diagnosis not present

## 2014-02-02 DIAGNOSIS — R634 Abnormal weight loss: Secondary | ICD-10-CM | POA: Diagnosis not present

## 2014-02-02 DIAGNOSIS — R627 Adult failure to thrive: Secondary | ICD-10-CM | POA: Diagnosis not present

## 2014-02-02 DIAGNOSIS — E46 Unspecified protein-calorie malnutrition: Secondary | ICD-10-CM | POA: Diagnosis not present

## 2014-02-10 DIAGNOSIS — R627 Adult failure to thrive: Secondary | ICD-10-CM | POA: Diagnosis not present

## 2014-02-10 DIAGNOSIS — E46 Unspecified protein-calorie malnutrition: Secondary | ICD-10-CM | POA: Diagnosis not present

## 2014-02-10 DIAGNOSIS — F0390 Unspecified dementia without behavioral disturbance: Secondary | ICD-10-CM | POA: Diagnosis not present

## 2014-02-10 DIAGNOSIS — R634 Abnormal weight loss: Secondary | ICD-10-CM | POA: Diagnosis not present

## 2014-02-20 DIAGNOSIS — E46 Unspecified protein-calorie malnutrition: Secondary | ICD-10-CM | POA: Diagnosis not present

## 2014-02-20 DIAGNOSIS — R627 Adult failure to thrive: Secondary | ICD-10-CM | POA: Diagnosis not present

## 2014-02-20 DIAGNOSIS — F0390 Unspecified dementia without behavioral disturbance: Secondary | ICD-10-CM | POA: Diagnosis not present

## 2014-02-20 DIAGNOSIS — R634 Abnormal weight loss: Secondary | ICD-10-CM | POA: Diagnosis not present

## 2014-03-05 DIAGNOSIS — R627 Adult failure to thrive: Secondary | ICD-10-CM | POA: Diagnosis not present

## 2014-03-05 DIAGNOSIS — R634 Abnormal weight loss: Secondary | ICD-10-CM | POA: Diagnosis not present

## 2014-03-05 DIAGNOSIS — F0390 Unspecified dementia without behavioral disturbance: Secondary | ICD-10-CM | POA: Diagnosis not present

## 2014-03-05 DIAGNOSIS — E46 Unspecified protein-calorie malnutrition: Secondary | ICD-10-CM | POA: Diagnosis not present

## 2014-03-16 DIAGNOSIS — R634 Abnormal weight loss: Secondary | ICD-10-CM | POA: Diagnosis not present

## 2014-03-16 DIAGNOSIS — R627 Adult failure to thrive: Secondary | ICD-10-CM | POA: Diagnosis not present

## 2014-03-16 DIAGNOSIS — E46 Unspecified protein-calorie malnutrition: Secondary | ICD-10-CM | POA: Diagnosis not present

## 2014-03-16 DIAGNOSIS — F0390 Unspecified dementia without behavioral disturbance: Secondary | ICD-10-CM | POA: Diagnosis not present

## 2014-09-14 ENCOUNTER — Telehealth: Payer: Self-pay | Admitting: *Deleted

## 2014-09-14 NOTE — Telephone Encounter (Signed)
Family member calling to schedule a NP appt, transferred to Shauna Hugh the NP coordinator

## 2014-09-23 ENCOUNTER — Ambulatory Visit (INDEPENDENT_AMBULATORY_CARE_PROVIDER_SITE_OTHER): Payer: Medicare Other | Admitting: Neurology

## 2014-09-23 ENCOUNTER — Encounter: Payer: Self-pay | Admitting: Neurology

## 2014-09-23 DIAGNOSIS — F0391 Unspecified dementia with behavioral disturbance: Secondary | ICD-10-CM | POA: Diagnosis not present

## 2014-09-23 DIAGNOSIS — R413 Other amnesia: Secondary | ICD-10-CM | POA: Diagnosis not present

## 2014-09-23 DIAGNOSIS — R634 Abnormal weight loss: Secondary | ICD-10-CM | POA: Diagnosis not present

## 2014-09-23 DIAGNOSIS — E559 Vitamin D deficiency, unspecified: Secondary | ICD-10-CM | POA: Diagnosis not present

## 2014-09-23 DIAGNOSIS — F0390 Unspecified dementia without behavioral disturbance: Secondary | ICD-10-CM | POA: Insufficient documentation

## 2014-09-23 MED ORDER — VITAMIN B-1 100 MG PO TABS: 100.0000 mg | ORAL_TABLET | Freq: Every day | ORAL | Status: DC

## 2014-09-23 MED ORDER — FA-PYRIDOXINE-CYANOCOBALAMIN 2.5-25-2 MG PO TABS: 1.0000 | ORAL_TABLET | Freq: Every day | ORAL | Status: DC

## 2014-09-23 NOTE — Patient Instructions (Signed)
We will check some blood work on the way out today. Somebody will call to schedule an MRI of the brain  She should continue to take donepezil.  I will start a prescription vitamin and have sent the prescription in to [facility] pharmacy.

## 2014-09-23 NOTE — Progress Notes (Signed)
[facility]  PATIENT: Audrey Norris DOB: 1940/07/31  REFERRING DOCTOR OR PCP:  Audrey Norris SOURCE: patient, niece, records in EMR, images on PACS  _________________________________   HISTORICAL  CHIEF COMPLAINT:  Chief Complaint  Patient presents with  . Memory Loss    Audrey Norris is here with her niece Audrey Norris for eval of memory loss.  Audrey Norris sts. she is not aware of why she is seeing Dr. Felecia Shelling today.  Sts. she is aware she has problems with her memory but is unable to give an example of what sorts of things she has trouble remembering.  Audrey Norris sts.in addition to forgetting people she has seen, she also forgets to turn her oven off, she turns her fridge off.  Onset of memory loss many yrs. ago.  At one point she was in an assisted living facility, but she walked away from   . Memory Loss    that facility--she was able to find her way home, and has been living at home alone since.  Once she went to a pharmacy alone, apparently became more confused while she was there, and EMS had to be called./fim    HISTORY OF PRESENT ILLNESS:  I had the pleasure of seeing your patient, Audrey Norris, at [facility] for neurologic consultation regarding her memory loss. As you know, she is a 74 year old woman who has had progressive difficulty with memory over the last 4 or 5 years, she is accompanied by her niece who helps with some of her history. She has had problems with forgetting to turn things off or locking the doors she stopped driving about 1 year ago. She was living in an assisted living facility at one point but walked away and was able to find her way back home. She has been living at home alone since that time.  Today, she had the [facility] cognitive assessment test. She scored 14/30. She scored 3/5 for visual spatial and executive function, 3/3 for naming, life/6 for attention, 1/3 for language, 1/24 abstraction, 0/5 for delayed recall/memory and 1/6  for orientation.  She was placed on donepezil 10 mg about half a year ago. Initially she was more regular with taking her medication but now misses some doses.  She denies any difficulty with her walking. She denies any difficulty with bladder function. She was having headaches but these are less common than the word last year.  Last year, she lost quite a bit of weight when she was complaining of headache and stomach pain. She got down to 87 pounds. She is eating better now and is back up to 127.     During an ED visit 01/17/2014, she was noted to be oriented to place but not to date.  I personally reviewed the CT scan from 01/17/2014. It shows mild atrophy, possibly a little worse in the mesial temporal lobes but close to expected brain volume for age.  REVIEW OF SYSTEMS: Constitutional: No fevers, chills, sweats, or change in appetite Eyes: No visual changes, double vision, eye pain Ear, nose and throat: No hearing loss, ear pain, nasal congestion, sore throat Cardiovascular: No chest pain, palpitations Respiratory: No shortness of breath at rest or with exertion.   No wheezes GastrointestinaI: No nausea, vomiting, diarrhea, abdominal pain, fecal incontinence Genitourinary: No dysuria, urinary retention or frequency.  No nocturia. Musculoskeletal: No neck pain, back pain Integumentary: No rash, pruritus, skin lesions Neurological: as above Psychiatric: No depression at this time.  No anxiety Endocrine: No palpitations, diaphoresis,  change in appetite, change in weigh or increased thirst Hematologic/Lymphatic: No anemia, purpura, petechiae. Allergic/Immunologic: No itchy/runny eyes, nasal congestion, recent allergic reactions, rashes  ALLERGIES: No Known Allergies  HOME MEDICATIONS:  Current outpatient prescriptions:  .  omeprazole (PRILOSEC) 40 MG capsule, Take 40 mg by mouth daily., Disp: , Rfl:  .  donepezil (ARICEPT) 10 MG tablet, Take 5 mg by mouth daily., Disp: , Rfl:  3 .  folic acid-pyridoxine-cyancobalamin (FOLTX) 2.5-25-2 MG TABS, Take 1 tablet by mouth daily., Disp: 30 each, Rfl: 11 .  sucralfate (CARAFATE) 1 G tablet, Take 1 g by mouth 3 (three) times daily., Disp: , Rfl: 3 .  thiamine (VITAMIN B-1) 100 MG tablet, Take 1 tablet (100 mg total) by mouth daily., Disp: 30 tablet, Rfl: 11  PAST MEDICAL HISTORY: Past Medical History  Diagnosis Date  . Memory loss   . Vision abnormalities   . Coronary artery disease     PAST SURGICAL HISTORY: Past Surgical History  Procedure Laterality Date  . Vesicovaginal fistula closure w/ tah      FAMILY HISTORY: Family History  Problem Relation Age of Onset  . Uterine cancer Mother   . Dementia Father     SOCIAL HISTORY:  History   Social History  . Marital Status: Divorced    Spouse Name: N/A  . Number of Children: N/A  . Years of Education: N/A   Occupational History  . Not on file.   Social History Main Topics  . Smoking status: Former Smoker    Types: Cigarettes  . Smokeless tobacco: Not on file  . Alcohol Use: No  . Drug Use: Not on file  . Sexual Activity: Not on file   Other Topics Concern  . Not on file   Social History Narrative     PHYSICAL EXAM  There were no vitals filed for this visit.  There is no height or weight on file to calculate BMI.   General: The patient is well-developed and well-nourished and in no acute distress  Eyes:  Funduscopic exam shows normal optic discs and retinal vessels.  Neck: The neck is supple, no carotid bruits are noted.  The neck is nontender.  Cardiovascular: The heart has a regular rate and rhythm with a normal S1 and S2. There were no murmurs, gallops or rubs. Lungs are clear to auscultation.  Skin: Extremities are without significant edema.  Musculoskeletal:  Back is nontender  Neurologic Exam  Mental status: The patient is alert but oriented only to name and [facility]. When asked her age she says she is 74. However when I  tell her that the year is 2016 she says her age is 74 or 74. The patient had very poor recent but good remote memory.   She had a mildly reduced attention span and concentration ability.   Speech is normal.  Cranial nerves: Extraocular movements are full. Pupils are equal, round, and reactive to light and accomodation.  Visual fields are full.  Facial symmetry is present. There is good facial sensation to soft touch bilaterally.Facial strength is normal.  Trapezius and sternocleidomastoid strength is normal. No dysarthria is noted.  The tongue is midline, and the patient has symmetric elevation of the soft palate. No obvious hearing deficits are noted.  Motor:  Muscle bulk is normal.   Tone is normal. Strength is  5 / 5 in all 4 extremities.   Sensory: Sensory testing is intact to pinprick, soft touch and vibration sensation in all 4 extremities.  Coordination: Cerebellar testing reveals good finger-nose-finger and heel-to-shin bilaterally.  Gait and station: Station is normal.   Gait is normal. Tandem gait is normal. Romberg is negative.   Reflexes: Deep tendon reflexes are symmetric and normal bilaterally.   Plantar responses are flexor.    DIAGNOSTIC DATA (LABS, IMAGING, TESTING) - I reviewed patient records, labs, notes, testing and imaging myself where available.  Lab Results  Component Value Date   WBC 5.5 01/17/2014   HGB 10.8* 01/17/2014   HCT 32.1* 01/17/2014   MCV 95.3 01/17/2014   PLT 416* 01/17/2014      Component Value Date/Time   NA 143 01/17/2014 1735   K 3.1* 01/17/2014 1735   CL 101 01/17/2014 1735   CO2 21 01/17/2014 1735   GLUCOSE 83 01/17/2014 1735   BUN 13 01/17/2014 1735   CREATININE 0.71 01/17/2014 1735   CALCIUM 9.9 01/17/2014 1735   CALCIUM 10.5 06/12/2009 0455   PROT 6.9 01/17/2014 1735   ALBUMIN 3.7 01/17/2014 1735   AST 29 01/17/2014 1735   ALT 10 01/17/2014 1735   ALKPHOS 49 01/17/2014 1735   BILITOT 0.4 01/17/2014 1735   GFRNONAA 84*  01/17/2014 1735   GFRAA >90 01/17/2014 1735   No results found for: CHOL, HDL, LDLCALC, LDLDIRECT, TRIG, CHOLHDL No results found for: HGBA1C Lab Results  Component Value Date   VITAMINB12 443 10/13/2009   Lab Results  Component Value Date   TSH 0.451 10/13/2009       ASSESSMENT AND PLAN  Memory loss - Plan: Vitamin B12, TSH, Sedimentation rate, Vitamin B1, Vit D  25 hydroxy (rtn osteoporosis monitoring)  Dementia, with behavioral disturbance - Plan: Vitamin B12, TSH, Sedimentation rate, Vitamin B1, Vit D  25 hydroxy (rtn osteoporosis monitoring)  Weight loss, abnormal - Plan: Vitamin B12, TSH, Sedimentation rate, Vitamin B1, Vit D  25 hydroxy (rtn osteoporosis monitoring)   In summary, Sheila Ocasio is a 74 year old woman who has had cognitive decline over the past few years. She has an interesting pattern of cognitive dysfunction on the [facility] cognitive assessment test. She did fairly well with visual spatial and attentional tasks and extremely poorly with memory and orientation. Personality is maintained, though she gets upset when she has memory difficulties at times. She could have an atypical Alzheimer's disease but I am also concerned that she may have a Wernicke's encephalopathy due to a nutritional deficiency (especially thiamine) that occurred during the time she lost more than 40 pounds last year.   I will check some blood work for inflammatory disease and nutritional derangement.    Additionally, I will check a noncontrasted MRI to check for ischemic changes  and hydrocephalus and to determine if there is abnormal signal in the mamillary bodies that would be seen with Wernicke's encephalopathy.    I will have her take thiamine and folic acid.  She will continue donepezil.  She will return to see me in 6 weeks or sooner if there are new or worsening neurologic symptoms.   Marieme Mcmackin A. Felecia Shelling, MD, PhD 06/23/6254, 38:93 AM Certified in Neurology, Clinical Neurophysiology, Sleep  Medicine, Pain Medicine and Neuroimaging  [facility] [address] [place], [place] [address] [phone]

## 2014-09-25 LAB — VITAMIN B1: Thiamine: 138.1 nmol/L (ref 66.5–200.0)

## 2014-09-25 LAB — TSH: TSH: 2.62 u[IU]/mL (ref 0.450–4.500)

## 2014-09-25 LAB — VITAMIN B12: VITAMIN B 12: 223 pg/mL (ref 211–946)

## 2014-09-25 LAB — VITAMIN D 25 HYDROXY (VIT D DEFICIENCY, FRACTURES): Vit D, 25-Hydroxy: 7.2 ng/mL — ABNORMAL LOW (ref 30.0–100.0)

## 2014-09-25 LAB — SEDIMENTATION RATE: SED RATE: 11 mm/h (ref 0–40)

## 2014-09-29 ENCOUNTER — Telehealth: Payer: Self-pay | Admitting: *Deleted

## 2014-09-29 DIAGNOSIS — F03918 Unspecified dementia, unspecified severity, with other behavioral disturbance: Secondary | ICD-10-CM

## 2014-09-29 DIAGNOSIS — R413 Other amnesia: Secondary | ICD-10-CM

## 2014-09-29 DIAGNOSIS — F0391 Unspecified dementia with behavioral disturbance: Secondary | ICD-10-CM

## 2014-09-29 DIAGNOSIS — R634 Abnormal weight loss: Secondary | ICD-10-CM

## 2014-09-29 MED ORDER — VITAMIN D (ERGOCALCIFEROL) 1.25 MG (50000 UNIT) PO CAPS: 50000.0000 [IU] | ORAL_CAPSULE | ORAL | Status: DC

## 2014-09-29 NOTE — Telephone Encounter (Signed)
-----   Message from Britt Bottom, MD sent at 09/28/2014  9:53 AM EDT ----- Vitamin D is very low.   50,000 units weekly 12 weeks then 4000 -  5000 units daily OTC.       Vit B12 is borderline low.    Take B12 OTC

## 2014-09-29 NOTE — Telephone Encounter (Signed)
I have spoken with Audrey Norris's niece, Barbaraann Share this morning and per RAS, advised that vit. d level is very low (7.2), and advised of the need for rx. vit. d 50,000iu weekly for 12 weeks, then otc 4-5,000iu vit. d daily.  I have also advised Audrey Norris of low normal vit. b12, and need for otc daily supplement.  She verbalized understanding of same.  Rx. for vit. d escribed to [facility] per Audrey Norris's request.  Also, it does not appear mri brain without contrast was ordered at last ov.  Per ov note, RAS intended to do this, so I have ordered it today/fim

## 2014-10-15 ENCOUNTER — Ambulatory Visit
Admission: RE | Admit: 2014-10-15 | Discharge: 2014-10-15 | Disposition: A | Discharge status: Home / Self Care | Payer: PPO | Source: Ambulatory Visit | Attending: Neurology | Admitting: Neurology

## 2014-10-15 DIAGNOSIS — G319 Degenerative disease of nervous system, unspecified: Secondary | ICD-10-CM | POA: Diagnosis not present

## 2014-10-15 DIAGNOSIS — R634 Abnormal weight loss: Secondary | ICD-10-CM

## 2014-10-15 DIAGNOSIS — F03918 Unspecified dementia, unspecified severity, with other behavioral disturbance: Secondary | ICD-10-CM

## 2014-10-15 DIAGNOSIS — R413 Other amnesia: Secondary | ICD-10-CM

## 2014-10-15 DIAGNOSIS — F0391 Unspecified dementia with behavioral disturbance: Secondary | ICD-10-CM

## 2014-10-15 DIAGNOSIS — G9389 Other specified disorders of brain: Secondary | ICD-10-CM | POA: Diagnosis not present

## 2014-10-21 ENCOUNTER — Telehealth: Payer: Self-pay | Admitting: Neurology

## 2014-10-21 NOTE — Telephone Encounter (Signed)
Spoke with niece, Barbaraann Share (listed on DPR form) and told her results can take up to a week to come back. Told her results not ready, but I will ask Dr. Felecia Shelling and call her back when they are ready. She verbalized understanding.

## 2014-10-21 NOTE — Telephone Encounter (Signed)
Patient's niece called requesting MRI results. Please call and advise. She can be eached at [phone].

## 2014-10-22 ENCOUNTER — Other Ambulatory Visit: Payer: Self-pay | Admitting: Neurology

## 2014-10-22 DIAGNOSIS — F0391 Unspecified dementia with behavioral disturbance: Secondary | ICD-10-CM

## 2014-10-22 NOTE — Addendum Note (Signed)
Addended by: Edison Pace, Nima Bamburg L on: 10/22/2014 11:10 AM   Modules accepted: Medications

## 2014-10-22 NOTE — Telephone Encounter (Signed)
MRI showed some atrophy which is often seen with memory loss

## 2014-10-22 NOTE — Telephone Encounter (Signed)
Spoke w/ Barbaraann Share about MRI brain. Showed some atrophy (shrinkage of the brain) often seen with memory loss. She wanted to know what stage dementia she was in. Also wanted to know if they could get a referral to mental health. I advised her I would speak w/ Dr. Felecia Shelling and see if he feels this is appropriate and call her back. She verbalized understanding.

## 2014-10-22 NOTE — Telephone Encounter (Signed)
Spoke w/ Barbaraann Share and told her pt is mild to moderate dementia. Dr. Felecia Shelling would like pt to take '10mg'$  instead of '5mg'$  of donezipil (Aricept) (full dose). A referral was placed to Psych. Told her it can take up to a week for place to call and schedule pt. She verbalized understanding.

## 2014-10-22 NOTE — Telephone Encounter (Signed)
She would be mild to moderate  I put in a referral to Psych.       I think she is on 1/2 of a donepezil 10 mg --  She can go up to the full dose

## 2014-10-27 ENCOUNTER — Telehealth: Payer: Self-pay | Admitting: Psychology

## 2014-10-27 NOTE — Telephone Encounter (Signed)
Follow-up on referral from Dr.Sater of [facility] on patient with mild to moderate dementia. It was documented that patient's niece Audrey Norris) had requested referral to "mental health" for her aunt. I called niece today to clarify reason for referral. Niece states that she was told by her friends to contact "mental health" in order to access social service and support programs to maximize her aunt's quality of life. I directed her to contact [facility] ("[facility]") at [phone] for this purpose. I do not believe neuropsychological services are indicated at this time but will confirm with Dr. Felecia Norris.

## 2014-11-02 ENCOUNTER — Encounter: Payer: Self-pay | Admitting: Neurology

## 2014-11-02 DIAGNOSIS — E559 Vitamin D deficiency, unspecified: Secondary | ICD-10-CM | POA: Insufficient documentation

## 2014-11-04 ENCOUNTER — Ambulatory Visit (INDEPENDENT_AMBULATORY_CARE_PROVIDER_SITE_OTHER): Payer: Medicare Other | Admitting: Neurology

## 2014-11-04 ENCOUNTER — Encounter: Payer: Self-pay | Admitting: Neurology

## 2014-11-04 VITALS — BP 136/80 | HR 84 | Resp 18 | Ht 67.0 in | Wt 130.0 lb

## 2014-11-04 DIAGNOSIS — G309 Alzheimer's disease, unspecified: Secondary | ICD-10-CM

## 2014-11-04 DIAGNOSIS — F0390 Unspecified dementia without behavioral disturbance: Secondary | ICD-10-CM

## 2014-11-04 DIAGNOSIS — F0280 Dementia in other diseases classified elsewhere without behavioral disturbance: Secondary | ICD-10-CM

## 2014-11-04 DIAGNOSIS — E559 Vitamin D deficiency, unspecified: Secondary | ICD-10-CM | POA: Diagnosis not present

## 2014-11-04 NOTE — Progress Notes (Signed)
[facility]  PATIENT: Audrey Norris DOB: [birth year]  REFERRING DOCTOR OR PCP:  Lindwood Qua SOURCE: patient, niece, records in EMR, images on PACS  _________________________________   HISTORICAL  CHIEF COMPLAINT:  Chief Complaint  Patient presents with  . Memory Loss    Lakendra and her niece Barbaraann Share both say memory is about the same.  She increased Aricept from '5mg'$  daily to '10mg'$  daily./fim    HISTORY OF PRESENT ILLNESS:  Audrey Norris is a 74 year old woman who has had progressive difficulty with memory over the last 4 or 5 years.  Since the last visit, she has had an MRI showing general atrophy, worse in mesial temporal lobes.     She also had mild microvascular changes.      She is now taking 10 mg Aricept and Foltx daily.   She tolerates them well.    Vit D was low and she supplement with 50000 weekly x 1 month and advised to keep doing with OTC.    Dementia history reviewed:    Over the past couple years, she has had problems with forgetting to turn things off or locking the doors she stopped driving in [id number].  She was living in an assisted living facility at one point but walked away and was able to find her way back home. She has been living at home alone since that time.   She was placed on donepezil but took irregularly until recently.    She is walking well.  No weakness or numbness.  . She denies any difficulty with bladder function.   She is having headaches but these are less common than last year.     In 2015,  she lost quite a bit of weight when she was complaining of headache and stomach pain. She got down to 87 pounds. She is eating better now and is back up to 127.     She is on omeprazole and sucralfate with benefit  REVIEW OF SYSTEMS: Constitutional: No fevers, chills, sweats, or change in appetite Eyes: No visual changes, double vision, eye pain Ear, nose and throat: No hearing loss, ear pain, nasal congestion, sore  throat Cardiovascular: No chest pain, palpitations Respiratory: No shortness of breath at rest or with exertion.   No wheezes GastrointestinaI: Occ GERD pain.   No nausea, vomiting, diarrhea, abdominal pain, fecal incontinence Genitourinary: No dysuria, urinary retention or frequency.  No nocturia. Musculoskeletal: No neck pain, back pain Integumentary: No rash, pruritus, skin lesions Neurological: as above Psychiatric: No depression at this time.  No anxiety Endocrine: No palpitations, diaphoresis, change in appetite, change in weigh or increased thirst Hematologic/Lymphatic: No anemia, purpura, petechiae. Allergic/Immunologic: No itchy/runny eyes, nasal congestion, recent allergic reactions, rashes  ALLERGIES: No Known Allergies  HOME MEDICATIONS:  Current outpatient prescriptions:  .  donepezil (ARICEPT) 10 MG tablet, Take 10 mg by mouth daily. , Disp: , Rfl: 3 .  omeprazole (PRILOSEC) 40 MG capsule, Take 40 mg by mouth daily., Disp: , Rfl:  .  sucralfate (CARAFATE) 1 G tablet, Take 1 g by mouth 3 (three) times daily., Disp: , Rfl: 3 .  L-Methylfolate-B6-B12 (FOLTX) 1.13-25-2 MG TABS, Take 1 tablet by mouth daily., Disp: , Rfl: 11  PAST MEDICAL HISTORY: Past Medical History  Diagnosis Date  . Memory loss   . Vision abnormalities   . Coronary artery disease     PAST SURGICAL HISTORY: Past Surgical History  Procedure Laterality Date  . Vesicovaginal fistula closure w/ tah  FAMILY HISTORY: Family History  Problem Relation Age of Onset  . Uterine cancer Mother   . Dementia Father     SOCIAL HISTORY:  Social History   Social History  . Marital Status: Divorced    Spouse Name: N/A  . Number of Children: N/A  . Years of Education: N/A   Occupational History  . Not on file.   Social History Main Topics  . Smoking status: Former Smoker    Types: Cigarettes  . Smokeless tobacco: Not on file  . Alcohol Use: No  . Drug Use: Not on file  . Sexual  Activity: Not on file   Other Topics Concern  . Not on file   Social History Narrative     PHYSICAL EXAM  Filed Vitals:   11/04/14 1105  BP: 136/80  Pulse: 84  Resp: 18  Height: '5\' 7"'$  (1.702 m)  Weight: 130 lb (58.968 kg)    Body mass index is 20.36 kg/(m^2).   General: The patient is well-developed and well-nourished and in no acute distress  Skin: Extremities are without significant edema.  Musculoskeletal:  Back is nontender  Neurologic Exam  Mental status: The patient is alert but oriented only to name and [place] but not date. Age is "74 something". The patient had very poor recent but good remote memory.   She had reduced attention span (DLORW) .   Speech is normal.  Cranial nerves: Extraocular movements are full.   There is good facial sensation to soft touch bilaterally.Facial strength is normal.  Trapezius and sternocleidomastoid strength is normal. No dysarthria is noted.    No obvious hearing deficits are noted.  Motor:  Muscle bulk is normal.   Tone is normal. Strength is  5 / 5 in all 4 extremities.   Sensory: Sensory testing is intact to pinprick, soft touch and vibration sensation in all 4 extremities.  Coordination: Cerebellar testing reveals good finger-nose-finger and heel-to-shin bilaterally.  Gait and station: Station is normal.   Gait is normal. Tandem gait is normal. Romberg is negative.   Reflexes: Deep tendon reflexes are symmetric and normal bilaterally.   Plantar responses are flexor.    DIAGNOSTIC DATA (LABS, IMAGING, TESTING) - I reviewed patient records, labs, notes, testing and imaging myself where available.  Lab Results  Component Value Date   WBC 5.5 01/17/2014   HGB 10.8* 01/17/2014   HCT 32.1* 01/17/2014   MCV 95.3 01/17/2014   PLT 416* 01/17/2014      Component Value Date/Time   NA 143 01/17/2014 1735   K 3.1* 01/17/2014 1735   CL 101 01/17/2014 1735   CO2 21 01/17/2014 1735   GLUCOSE 83 01/17/2014 1735   BUN 13  01/17/2014 1735   CREATININE 0.71 01/17/2014 1735   CALCIUM 9.9 01/17/2014 1735   CALCIUM 10.5 06/12/2009 0455   PROT 6.9 01/17/2014 1735   ALBUMIN 3.7 01/17/2014 1735   AST 29 01/17/2014 1735   ALT 10 01/17/2014 1735   ALKPHOS 49 01/17/2014 1735   BILITOT 0.4 01/17/2014 1735   GFRNONAA 84* 01/17/2014 1735   GFRAA >90 01/17/2014 1735   No results found for: CHOL, HDL, LDLCALC, LDLDIRECT, TRIG, CHOLHDL No results found for: HGBA1C Lab Results  Component Value Date   VITAMINB12 223 09/23/2014   Lab Results  Component Value Date   TSH 2.620 09/23/2014       ASSESSMENT AND PLAN  Dementia, without behavioral disturbance  Alzheimer's disease  Vitamin D deficiency    1.  Continue donepezil 10 mg.  Consider Namenda but they are concerned about the price of the medicine.   Continue Foltx. 2.   Continue vitamin D supplements. Okay to take 2000 units daily. 3.   Advised to stay active. Between the brother and her niece, she will be checked on a couple times a day. 4.   She will return to see me in 6 months or sooner if there are new or worsening neurologic symptoms.   Euclid Cassetta A. Felecia Shelling, MD, PhD 2/50/0370, 48:88 AM Certified in Neurology, Clinical Neurophysiology, Sleep Medicine, Pain Medicine and Neuroimaging  [facility] [address], [place], [place] [address] [phone]

## 2014-11-25 ENCOUNTER — Other Ambulatory Visit: Payer: Self-pay

## 2014-11-25 MED ORDER — DONEPEZIL HCL 10 MG PO TABS: 10.0000 mg | ORAL_TABLET | Freq: Every day | ORAL | Status: DC

## 2015-05-05 ENCOUNTER — Encounter: Payer: Self-pay | Admitting: Neurology

## 2015-05-05 ENCOUNTER — Ambulatory Visit (INDEPENDENT_AMBULATORY_CARE_PROVIDER_SITE_OTHER): Payer: Medicare HMO | Admitting: Neurology

## 2015-05-05 DIAGNOSIS — F418 Other specified anxiety disorders: Secondary | ICD-10-CM

## 2015-05-05 DIAGNOSIS — F0280 Dementia in other diseases classified elsewhere without behavioral disturbance: Secondary | ICD-10-CM

## 2015-05-05 DIAGNOSIS — G301 Alzheimer's disease with late onset: Secondary | ICD-10-CM | POA: Diagnosis not present

## 2015-05-05 MED ORDER — SERTRALINE HCL 50 MG PO TABS: 50.0000 mg | ORAL_TABLET | Freq: Every day | ORAL | Status: DC

## 2015-05-05 MED ORDER — FOLIC ACID 1 MG PO TABS: 2.0000 mg | ORAL_TABLET | Freq: Every day | ORAL | Status: DC

## 2015-05-05 NOTE — Progress Notes (Signed)
[facility]  PATIENT: Audrey Norris DOB: [birth year]  REFERRING DOCTOR OR PCP:  Lindwood Qua SOURCE: patient, niece, records in EMR, images on PACS  _________________________________   HISTORICAL  CHIEF COMPLAINT:  Chief Complaint  Patient presents with  . Dementia    Dorthie sts. memory is some better--sts. she can remember where things are better, doesn't get lost as much.  Niece sts. memory is about the same.  Niece is in the process of signing pt. of for [facility] services/fim    HISTORY OF PRESENT ILLNESS:  Chere Babson is a 75 year old woman who has had progressive difficulty with memory over the last 4 years.    In July 2016 when first seen, her MoCA score was 14/30 and c/w Alzheimer's disease.     She lives alone but family checks in personally every day.    I showed the MRI images to Mrs. Baird Cancer and her niece.   The MRI shows general atrophy, worse in mesial temporal lobes.     She also had mild microvascular changes.      She is now taking 10 mg Aricept and Foltx daily.   She tolerates them well but Foltx is very expensive and she can't afford.     Vit D was low and she supplement with 50000 weekly x 1 month and advised to keep doing with OTC.   She had not wanted to add Namenda in the past.   She is walking well.  No weakness or numbness.  . She denies any difficulty with bladder function.   She has some anxiety and occasionally seems depressed.   She has not had crying spells.     REVIEW OF SYSTEMS: Constitutional: No fevers, chills, sweats, or change in appetite Eyes: No visual changes, double vision, eye pain Ear, nose and throat: No hearing loss, ear pain, nasal congestion, sore throat Cardiovascular: No chest pain, palpitations Respiratory: No shortness of breath at rest or with exertion.   No wheezes GastrointestinaI: Occ GERD pain.   No nausea, vomiting, diarrhea, abdominal pain, fecal incontinence Genitourinary: No dysuria, urinary  retention or frequency.  No nocturia. Musculoskeletal: No neck pain, back pain Integumentary: No rash, pruritus, skin lesions Neurological: as above Psychiatric: No depression at this time.  No anxiety Endocrine: No palpitations, diaphoresis, change in appetite, change in weigh or increased thirst Hematologic/Lymphatic: No anemia, purpura, petechiae. Allergic/Immunologic: No itchy/runny eyes, nasal congestion, recent allergic reactions, rashes  ALLERGIES: No Known Allergies  HOME MEDICATIONS:  Current outpatient prescriptions:  .  donepezil (ARICEPT) 10 MG tablet, Take 1 tablet (10 mg total) by mouth daily., Disp: 30 tablet, Rfl: 6 .  omeprazole (PRILOSEC) 40 MG capsule, Take 40 mg by mouth daily., Disp: , Rfl:  .  sucralfate (CARAFATE) 1 G tablet, Take 1 g by mouth 3 (three) times daily., Disp: , Rfl: 3 .  folic acid (FOLVITE) 1 MG tablet, Take 2 tablets (2 mg total) by mouth daily., Disp: 60 tablet, Rfl: 11 .  sertraline (ZOLOFT) 50 MG tablet, Take 1 tablet (50 mg total) by mouth daily., Disp: 30 tablet, Rfl: 11  PAST MEDICAL HISTORY: Past Medical History  Diagnosis Date  . Memory loss   . Vision abnormalities   . Coronary artery disease     PAST SURGICAL HISTORY: Past Surgical History  Procedure Laterality Date  . Vesicovaginal fistula closure w/ tah      FAMILY HISTORY: Family History  Problem Relation Age of Onset  . Uterine cancer Mother   .  Dementia Father     SOCIAL HISTORY:  Social History   Social History  . Marital Status: Divorced    Spouse Name: N/A  . Number of Children: N/A  . Years of Education: N/A   Occupational History  . Not on file.   Social History Main Topics  . Smoking status: Former Smoker    Types: Cigarettes  . Smokeless tobacco: Not on file  . Alcohol Use: No  . Drug Use: Not on file  . Sexual Activity: Not on file   Other Topics Concern  . Not on file   Social History Narrative     PHYSICAL EXAM  There were no  vitals filed for this visit.  There is no weight on file to calculate BMI.   General: The patient is well-developed and well-nourished and in no acute distress  Skin: Extremities are without significant edema.  Musculoskeletal:  Back is nontender  Neurologic Exam  Mental status: The patient is alert but oriented only to name and Lady Gary but not date. Age is "I dont't know, 75 something". The patient had very poor recent but good remote memory.   She had reduced attention span (WORLD - D?) .   Speech is normal.  Cranial nerves: Extraocular movements are full.   There is good facial sensation to soft touch bilaterally.Facial strength is normal.  Trapezius and sternocleidomastoid strength is normal. No dysarthria is noted.    No obvious hearing deficits are noted.  Motor:  Muscle bulk is normal.   Tone is normal. Strength is  5 / 5 in all 4 extremities.   Sensory: Sensory testing is intact to pinprick, soft touch and vibration sensation in all 4 extremities.  Coordination: Cerebellar testing reveals good finger-nose-finger and heel-to-shin bilaterally.  Gait and station: Station is normal.   Gait is normal. Tandem gait is normal. Romberg is negative.   Reflexes: Deep tendon reflexes are symmetric and normal bilaterally.   Plantar responses are flexor.    DIAGNOSTIC DATA (LABS, IMAGING, TESTING) - I reviewed patient records, labs, notes, testing and imaging myself where available.  Lab Results  Component Value Date   WBC 5.5 01/17/2014   HGB 10.8* 01/17/2014   HCT 32.1* 01/17/2014   MCV 95.3 01/17/2014   PLT 416* 01/17/2014      Component Value Date/Time   NA 143 01/17/2014 1735   K 3.1* 01/17/2014 1735   CL 101 01/17/2014 1735   CO2 21 01/17/2014 1735   GLUCOSE 83 01/17/2014 1735   BUN 13 01/17/2014 1735   CREATININE 0.71 01/17/2014 1735   CALCIUM 9.9 01/17/2014 1735   CALCIUM 10.5 06/12/2009 0455   PROT 6.9 01/17/2014 1735   ALBUMIN 3.7 01/17/2014 1735   AST 29  01/17/2014 1735   ALT 10 01/17/2014 1735   ALKPHOS 49 01/17/2014 1735   BILITOT 0.4 01/17/2014 1735   GFRNONAA 84* 01/17/2014 1735   GFRAA >90 01/17/2014 1735   No results found for: CHOL, HDL, LDLCALC, LDLDIRECT, TRIG, CHOLHDL No results found for: HGBA1C Lab Results  Component Value Date   VITAMINB12 223 09/23/2014   Lab Results  Component Value Date   TSH 2.620 09/23/2014       ASSESSMENT AND PLAN  Late onset Alzheimer's disease without behavioral disturbance  Depression with anxiety    1.  Continue donepezil 10 mg.  We discussed Namenda but they are concerned about the price of the medicine.   Change Foltx to 2 mg daily folic acid. 2.  Zoloft 50 mg daily. 3.   Advised to stay active. Family plans on regular checks on her (she still lives alone) 4.   She will return to see me in 6 months or sooner if there are new or worsening neurologic symptoms.   Khadeem Rockett A. Felecia Shelling, MD, PhD 5/68/6168, 3:72 PM Certified in Neurology, Clinical Neurophysiology, Sleep Medicine, Pain Medicine and Neuroimaging  [facility] [address] [place], [place] [address] [phone]

## 2015-07-28 ENCOUNTER — Encounter: Payer: Self-pay | Admitting: *Deleted

## 2015-07-28 ENCOUNTER — Telehealth: Payer: Self-pay | Admitting: Neurology

## 2015-07-28 NOTE — Telephone Encounter (Signed)
I have spoken with Audrey Norris this morning.  She requests a letter that simply sts. she is under Dr. Garth Bigness care for the tx. of Alzheimer's dz., and that Audrey Norris brings her to her appts.  Letter up front [facility]/fim

## 2015-07-28 NOTE — Telephone Encounter (Signed)
Pt's niece, Barbaraann Share, called and left message with answering service. She is requesting a letter for social security stating she has dementia. Please call when ready for pick up.  [phone]

## 2015-07-28 NOTE — Telephone Encounter (Signed)
Audrey Norris called and left message with answering service.  Message: confirm if letter is received, home # [phone]

## 2015-07-28 NOTE — Telephone Encounter (Signed)
Accident.  I can provide a copy of office notes, which details pt's dx. of late onset Alzheimer's dz.  Would this work?

## 2015-09-30 ENCOUNTER — Telehealth: Payer: Self-pay | Admitting: Neurology

## 2015-09-30 NOTE — Telephone Encounter (Signed)
I have spoken with Barbaraann Share and per RAS, advised he will complete FL2.  Once complete, I will place it up front for her to pick up/fim

## 2015-09-30 NOTE — Telephone Encounter (Signed)
Pt's niece called wanting to know if clinic had FL-2 form and if so can Dr Felecia Shelling fill it out. She said it is time for her to placed in a assisted living facility. She is getting the pt set up for medicaid also. Please call

## 2015-10-01 ENCOUNTER — Telehealth: Payer: Self-pay | Admitting: *Deleted

## 2015-10-01 MED ORDER — DONEPEZIL HCL 10 MG PO TABS: 10.0000 mg | ORAL_TABLET | Freq: Every day | ORAL | Status: DC

## 2015-10-01 NOTE — Telephone Encounter (Signed)
Donepezil escribed to [facility] per faxed request/fim

## 2015-10-15 ENCOUNTER — Encounter: Payer: Self-pay | Admitting: *Deleted

## 2015-10-15 ENCOUNTER — Telehealth: Payer: Self-pay | Admitting: Neurology

## 2015-10-15 NOTE — Telephone Encounter (Signed)
Pt's niece called in requesting letter stating pt should not live alone. Letter is to get pt into an assisted living facility. Please call and advise [phone]

## 2015-10-15 NOTE — Telephone Encounter (Signed)
Letter up front [facility].  Audrey Norris is aware and will pick it up next week/fim

## 2015-11-03 ENCOUNTER — Ambulatory Visit (INDEPENDENT_AMBULATORY_CARE_PROVIDER_SITE_OTHER): Payer: Medicare HMO | Admitting: Neurology

## 2015-11-03 ENCOUNTER — Encounter: Payer: Self-pay | Admitting: Neurology

## 2015-11-03 VITALS — BP 110/68 | HR 68 | Resp 14 | Ht 67.0 in | Wt 117.5 lb

## 2015-11-03 DIAGNOSIS — F0280 Dementia in other diseases classified elsewhere without behavioral disturbance: Secondary | ICD-10-CM

## 2015-11-03 DIAGNOSIS — G301 Alzheimer's disease with late onset: Secondary | ICD-10-CM | POA: Diagnosis not present

## 2015-11-03 DIAGNOSIS — F418 Other specified anxiety disorders: Secondary | ICD-10-CM

## 2015-11-03 MED ORDER — DONEPEZIL HCL 10 MG PO TABS: 10.0000 mg | ORAL_TABLET | Freq: Every day | ORAL | 11 refills | Status: DC

## 2015-11-03 MED ORDER — FOLIC ACID 1 MG PO TABS: 2.0000 mg | ORAL_TABLET | Freq: Every day | ORAL | 11 refills | Status: DC

## 2015-11-03 MED ORDER — SERTRALINE HCL 50 MG PO TABS: 50.0000 mg | ORAL_TABLET | Freq: Every day | ORAL | 11 refills | Status: DC

## 2015-11-03 NOTE — Progress Notes (Signed)
[facility]  PATIENT: Audrey Norris DOB: May 27, 1940  REFERRING DOCTOR OR PCP:  Lindwood Qua SOURCE: patient, niece, records in EMR, images on PACS  _________________________________   HISTORICAL  CHIEF COMPLAINT:  Chief Complaint  Patient presents with  . Alzheimer's Disease    Pt. sts. she is doing well.  Niece sts. memory is about the same.  She refuses to go to an assisted living facility/fim    HISTORY OF PRESENT ILLNESS:  Audrey Norris is a 75 year old woman with Alzheimer's disease.      She has had progressive difficulty with memory over the last 4-5 years.     In July 2016 when first seen by me , her MoCA score was 14/30 and c/w Alzheimer's disease.   The MRI shows general atrophy, worse in mesial temporal lobes.     She also had mild microvascular changes.      She lives alone but family checks in personally every day.     She and her niece feel that her memory is stable.   However, she has been a little forgetful with tasks around the house.   Left oven on and doesn't always remember to lock up.      She is now taking 10 mg Aricept and Foltx daily.   She tolerates them well but Foltx is very expensive and she can't afford.      She had not wanted to add Namenda due to cost in the past.   Gait/strength/sensation/bladder:   She fell once last month but is walking about the same for the most part.    She is walking well.  No weakness or numbness.  . She denies any difficulty with bladder function.   Mood:   She has some anxiety and occasionally seems depressed.   She has not had crying spells since starting Zoloft.  She is interactive with family but preferds to stay at home  REVIEW OF SYSTEMS: Constitutional: No fevers, chills, sweats, or change in appetite.   Sleeps well Eyes: No visual changes, double vision, eye pain Ear, nose and throat: No hearing loss, ear pain, nasal congestion, sore throat Cardiovascular: No chest pain,  palpitations Respiratory: No shortness of breath at rest or with exertion.   No wheezes GastrointestinaI: Occ GERD pain.   No nausea, vomiting, diarrhea, abdominal pain, fecal incontinence Genitourinary: No dysuria, urinary retention or frequency.  No nocturia. Musculoskeletal: No neck pain, back pain Integumentary: No rash, pruritus, skin lesions Neurological: as above Psychiatric: Improved depression.  No anxiety Endocrine: No palpitations, diaphoresis, change in appetite, change in weigh or increased thirst Hematologic/Lymphatic: No anemia, purpura, petechiae. Allergic/Immunologic: No itchy/runny eyes, nasal congestion, recent allergic reactions, rashes  ALLERGIES: No Known Allergies  HOME MEDICATIONS:  Current Outpatient Prescriptions:  .  donepezil (ARICEPT) 10 MG tablet, Take 1 tablet (10 mg total) by mouth daily., Disp: 30 tablet, Rfl: 11 .  folic acid (FOLVITE) 1 MG tablet, Take 2 tablets (2 mg total) by mouth daily., Disp: 60 tablet, Rfl: 11 .  omeprazole (PRILOSEC) 40 MG capsule, Take 40 mg by mouth daily., Disp: , Rfl:  .  sertraline (ZOLOFT) 50 MG tablet, Take 1 tablet (50 mg total) by mouth daily., Disp: 30 tablet, Rfl: 11 .  sucralfate (CARAFATE) 1 G tablet, Take 1 g by mouth 3 (three) times daily., Disp: , Rfl: 3  PAST MEDICAL HISTORY: Past Medical History:  Diagnosis Date  . Coronary artery disease   . Memory loss   .  Vision abnormalities     PAST SURGICAL HISTORY: Past Surgical History:  Procedure Laterality Date  . VESICOVAGINAL FISTULA CLOSURE W/ TAH      FAMILY HISTORY: Family History  Problem Relation Age of Onset  . Uterine cancer Mother   . Dementia Father     SOCIAL HISTORY:  Social History   Social History  . Marital status: Divorced    Spouse name: N/A  . Number of children: N/A  . Years of education: N/A   Occupational History  . Not on file.   Social History Main Topics  . Smoking status: Former Smoker    Types: Cigarettes   . Smokeless tobacco: Not on file  . Alcohol use No  . Drug use: Unknown  . Sexual activity: Not on file   Other Topics Concern  . Not on file   Social History Narrative  . No narrative on file     PHYSICAL EXAM  Vitals:   11/03/15 1359  BP: 110/68  Pulse: 68  Resp: 14  Weight: 117 lb 8 oz (53.3 kg)  Height: '5\' 7"'$  (1.702 m)    Body mass index is 18.4 kg/m.   General: The patient is well-developed and well-nourished and in no acute distress  Skin: Extremities are without significant edema.  Musculoskeletal:  Back is nontender  Neurologic Exam  Mental status: The patient is alert but oriented only to name and Lady Gary but not date. Doe snot know age but knows birthdate.   The patient had very poor recent (0/3 even with prompt) but good remote memory.   She had reduced attention span (WORLD - D?) .   Speech is normal.  Cranial nerves: Extraocular movements are full.   There is good facial sensation to soft touch bilaterally.Facial strength is normal.  Trapezius and sternocleidomastoid strength is normal. No dysarthria is noted.    No obvious hearing deficits are noted.  Motor:  Muscle bulk is normal.   Tone is normal. Strength is  5 / 5 in all 4 extremities.   Sensory: Sensory testing is intact to touch and vibration sensation in all 4 extremities.  Coordination: Cerebellar testing reveals good finger-nose-finger  bilaterally.  Gait and station: Station is normal.   Gait is normal. Tandem gait is normal for age. Romberg is negative.   Reflexes: Deep tendon reflexes are symmetric and normal bilaterally.        DIAGNOSTIC DATA (LABS, IMAGING, TESTING) - I reviewed patient records, labs, notes, testing and imaging myself where available.  Lab Results  Component Value Date   WBC 5.5 01/17/2014   HGB 10.8 (L) 01/17/2014   HCT 32.1 (L) 01/17/2014   MCV 95.3 01/17/2014   PLT 416 (H) 01/17/2014      Component Value Date/Time   NA 143 01/17/2014 1735   K 3.1  (L) 01/17/2014 1735   CL 101 01/17/2014 1735   CO2 21 01/17/2014 1735   GLUCOSE 83 01/17/2014 1735   BUN 13 01/17/2014 1735   CREATININE 0.71 01/17/2014 1735   CALCIUM 9.9 01/17/2014 1735   CALCIUM 10.5 06/12/2009 0455   PROT 6.9 01/17/2014 1735   ALBUMIN 3.7 01/17/2014 1735   AST 29 01/17/2014 1735   ALT 10 01/17/2014 1735   ALKPHOS 49 01/17/2014 1735   BILITOT 0.4 01/17/2014 1735   GFRNONAA 84 (L) 01/17/2014 1735   GFRAA >90 01/17/2014 1735   No results found for: CHOL, HDL, LDLCALC, LDLDIRECT, TRIG, CHOLHDL No results found for: HGBA1C Lab Results  Component  Value Date   VITAMINB12 223 09/23/2014   Lab Results  Component Value Date   TSH 2.620 09/23/2014       ASSESSMENT AND PLAN  Late onset Alzheimer's disease without behavioral disturbance  Depression with anxiety   1.  Continue donepezil 10 mg daily.  Continue 2 mg daily folic acid. 2.   Zoloft 50 mg daily. 3.   Advised to stay active. Family will check in on her regularly.   Her family was trying to get her to go to an assisted living facility but Aqua wants to stay in her home.   Family is nearby per 4.   She will return to see me in 12 months or sooner if there are new or worsening neurologic symptoms.   Richard A. Felecia Shelling, MD, PhD 7/47/1855, 0:15 PM Certified in Neurology, Clinical Neurophysiology, Sleep Medicine, Pain Medicine and Neuroimaging  [facility] [address] [place], [place] [address] [phone]

## 2016-05-30 ENCOUNTER — Telehealth: Payer: Self-pay | Admitting: Neurology

## 2016-05-30 NOTE — Telephone Encounter (Signed)
[place].  Per RAS, ok for hydroxyzine 12.'5mg'$ , one po bid prn. #60.  Which pharmacy would she like me to send this to?

## 2016-05-30 NOTE — Telephone Encounter (Signed)
Pt's niece called says she is asking the same thing over and over again for the past couple of months. Her hygiene is declining, urinating on herself, refuses to take a shower, she does not know what day or year it is, niece has taken over the finances. Niece says she needs to be in a facility but she does not know how to go about it, pt says she is not going to a facility.

## 2016-05-30 NOTE — Telephone Encounter (Signed)
Extended, pleasant conversation with Audrey Norris.  She sts. pt. is still living alone- memory is worse.  She has times when she calls family members repeatedly, hygiene is suffering.  She was unable to get pt. to agree to going to a nsg. facility.  Sts. she spoke with [facility] but was told by them that pt. needed more care--should be in a facility.  Sts. pt. refuses further care. I have rec. she see the magistrate to arrange for an involuntary commitment due to dementia/danger to self, or possibly others (for ex. if she were to start a fire with the oven at home.)  She verbalized understanding of same.  I will check with RAS for other options and call pt. back/fim

## 2016-05-31 MED ORDER — HYDROXYZINE HCL 25 MG PO TABS: ORAL_TABLET | ORAL | 3 refills | Status: DC

## 2016-05-31 NOTE — Telephone Encounter (Signed)
Patient's niece calling back stating the patient wants Rx called to [facility] on the [address] of [address] but  would like a call back.to discuss before sending Rx.

## 2016-05-31 NOTE — Addendum Note (Signed)
Addended by: France Ravens I on: 05/31/2016 02:02 PM   Modules accepted: Orders

## 2016-07-20 ENCOUNTER — Ambulatory Visit: Payer: Medicare HMO | Admitting: Neurology

## 2016-07-21 ENCOUNTER — Encounter: Payer: Self-pay | Admitting: Neurology

## 2016-09-19 ENCOUNTER — Ambulatory Visit: Payer: Medicare HMO | Admitting: Neurology

## 2016-09-25 ENCOUNTER — Encounter: Payer: Self-pay | Admitting: Neurology

## 2016-10-31 ENCOUNTER — Telehealth: Payer: Self-pay | Admitting: Neurology

## 2016-10-31 NOTE — Telephone Encounter (Signed)
I have spoken with Barbaraann Share, and per her request, have mailed the letter that RAS wrote on 10/15/15, stating pt. should not live alone due to cognitive decline, to attny. [place]/fim

## 2016-10-31 NOTE — Telephone Encounter (Signed)
LMOM (identified vm) for Audrey Norris--I have attempted several times to fax letter to her attorney, but fax fails each time.  I can mail the letter to her home address, or she can pick it up from our office and take it to the attorney office.  Just need her to call me back and let me know what she would like me to do./fim

## 2016-10-31 NOTE — Telephone Encounter (Signed)
Niece Nadara Mustard (listed on DPR) called office to see if she is able to have another letter of patient's diagnosis due to trying to have patient placed in a assisted living facility.  Letter is going to be for attorney to assign a legal guardian to be sure patient will not sign herself out.  Attorney's name Silvio Pate Fax #- [phone].  Please fax letter to attorney's office when completed.  Contact Louise with any questions

## 2016-11-01 NOTE — Telephone Encounter (Signed)
Letter faxed to [phone] as requested/fim

## 2016-11-01 NOTE — Telephone Encounter (Signed)
I have spoken with Barbaraann Share this am.  I tried to fax letter again today from a different fax machine, and it still is not going thru. Her dtr., Fonnie Mu will pick letter up in our office. Letter up front [facility]/fim

## 2016-11-01 NOTE — Telephone Encounter (Signed)
Audrey Norris calling back asking you to fax letter to [phone]. This is the correct number.

## 2016-11-02 ENCOUNTER — Ambulatory Visit: Payer: Medicare HMO | Admitting: Neurology

## 2016-11-15 ENCOUNTER — Other Ambulatory Visit: Payer: Self-pay | Admitting: Neurology

## 2016-12-14 ENCOUNTER — Telehealth: Payer: Self-pay | Admitting: Neurology

## 2016-12-14 NOTE — Telephone Encounter (Signed)
Pt niece (on [place]) is asking that a FL2 be filled out for pt to be put into a care facility. The name of the place: [facility] contact person: [facility](placement coordinator) [phone].  Nadara Mustard is asking that Ms Norman Herrlich  Be contacted for what is needed. Pt niece is not asking for a call back

## 2016-12-14 NOTE — Telephone Encounter (Signed)
LMOM--RAS has not seen pt. since Aug. 2017, so is not able to complete paperwork. If she has seen her pcp more recently, pcp may be able to complete FL2 form/fim

## 2016-12-15 ENCOUNTER — Telehealth: Payer: Self-pay | Admitting: Neurology

## 2016-12-15 NOTE — Telephone Encounter (Signed)
Pt niece(on DPR) would like to know if TB test are done by Dr Felecia Shelling, please call

## 2016-12-15 NOTE — Telephone Encounter (Signed)
Spoke with Audrey Norris and explained we don't do the tb skin test she is looking for--pt's pcp would do this/fim

## 2016-12-22 NOTE — Telephone Encounter (Signed)
Spoke with Audrey Norris and explained that since pt. has not been seen here in over one yr., if she needs letter, forms completed, she should keep pending appt. with RAS.  She verbalized understanding of same/fim

## 2016-12-22 NOTE — Telephone Encounter (Signed)
Patients niece called office to advise patients PCP completed the [facility] paperwork.  She is now requesting a letter explaining the patients diagnosis.  Patient has an appointment on 01/01/17 with Dr. Conley Simmonds (neice) would like to know if she needs to keep this appointment or if it can now be canceled.  Please call

## 2017-01-01 ENCOUNTER — Encounter: Payer: Self-pay | Admitting: *Deleted

## 2017-01-01 ENCOUNTER — Ambulatory Visit (INDEPENDENT_AMBULATORY_CARE_PROVIDER_SITE_OTHER): Payer: Medicare Other | Admitting: Neurology

## 2017-01-01 ENCOUNTER — Encounter: Payer: Self-pay | Admitting: Neurology

## 2017-01-01 VITALS — BP 137/79 | HR 79 | Resp 17 | Ht 67.0 in | Wt 121.0 lb

## 2017-01-01 DIAGNOSIS — F0281 Dementia in other diseases classified elsewhere with behavioral disturbance: Secondary | ICD-10-CM

## 2017-01-01 DIAGNOSIS — F0280 Dementia in other diseases classified elsewhere without behavioral disturbance: Secondary | ICD-10-CM | POA: Diagnosis not present

## 2017-01-01 DIAGNOSIS — F418 Other specified anxiety disorders: Secondary | ICD-10-CM

## 2017-01-01 DIAGNOSIS — G301 Alzheimer's disease with late onset: Secondary | ICD-10-CM | POA: Diagnosis not present

## 2017-01-01 DIAGNOSIS — F02818 Dementia in other diseases classified elsewhere, unspecified severity, with other behavioral disturbance: Secondary | ICD-10-CM

## 2017-01-01 MED ORDER — MEMANTINE HCL ER 7 MG PO CP24: ORAL_CAPSULE | ORAL | 0 refills | Status: DC

## 2017-01-01 MED ORDER — MEMANTINE HCL ER 28 MG PO CP24: 28.0000 mg | ORAL_CAPSULE | Freq: Every day | ORAL | 5 refills | Status: DC

## 2017-01-01 NOTE — Patient Instructions (Signed)
I sent in Namenda XR (memantine) 7 mg tablets to [facility]  For 5 days, take 1 pill daily For the next 5 days, take 2 pills daily For the next 5 days, take 3 pills daily  Then start the other prescription (Memantine/Namenda XR 28 mg)   Continue donepezil at 10 mg daily.

## 2017-01-01 NOTE — Progress Notes (Signed)
[facility]  PATIENT: Audrey Norris DOB: 1941-03-14  REFERRING DOCTOR OR PCP:  Lindwood Qua SOURCE: patient, niece, records in EMR, images on PACS  _________________________________   HISTORICAL  CHIEF COMPLAINT:  Chief Complaint  Patient presents with  . Alzheimer's Disease    Memory continues to decline despite Aricept. She wondered away from home 12/30/16 and couldn't find her way back; a silver alert had to be issued.  [employer]. picked her up and took her home.  Her niece is trying to have her placed in a memory care unit, needs letter confirming her dx. and inability to continue to live alone/fim    HISTORY OF PRESENT ILLNESS:  Audrey Norris is a 76 year old woman with Alzheimer's disease.      Update 01/01/2017:    She is not noting a problem with her memory.    She feels she is fine but on Saturday she left a friend's house and stepped outside and then got lost and the police needed to be called to find her.     Her niece is looking into a memory unit.     Due to her worsening memory/cognition and her recent wandering, this is a good idea and I filled out an FL2 form.   She continues on donepezil.     Besides memory she is otherwise healthy.   She had mild depression but mood has been better recently.  She is sleeping well at night but naps a couple times daily.   Sometimes when she wakes up, she is not sure what day it is.      ___________________________________ From 11/03/2015  She has had progressive difficulty with memory over the last 4-5 years.     In July 2016 when first seen by me , her MoCA score was 14/30 and c/w Alzheimer's disease.   The MRI shows general atrophy, worse in mesial temporal lobes.     She also had mild microvascular changes.      She lives alone but family checks in personally every day.     She and her niece feel that her memory is stable.   However, she has been a little forgetful with tasks around the  house.   Left oven on and doesn't always remember to lock up.      She is now taking 10 mg Aricept and Foltx daily.   She tolerates them well but Foltx is very expensive and she can't afford.      She had not wanted to add Namenda due to cost in the past.   Gait/strength/sensation/bladder:   She fell once last month but is walking about the same for the most part.    She is walking well.  No weakness or numbness.  . She denies any difficulty with bladder function.   Mood:   She has some anxiety and occasionally seems depressed.   She has not had crying spells since starting Zoloft.  She is interactive with family but preferds to stay at home  REVIEW OF SYSTEMS: Constitutional: No fevers, chills, sweats, or change in appetite.   Sleeps well Eyes: No visual changes, double vision, eye pain Ear, nose and throat: No hearing loss, ear pain, nasal congestion, sore throat Cardiovascular: No chest pain, palpitations Respiratory: No shortness of breath at rest or with exertion.   No wheezes GastrointestinaI: Occ GERD pain.   No nausea, vomiting, diarrhea, abdominal pain, fecal incontinence Genitourinary: No dysuria, urinary retention or frequency.  No  nocturia. Musculoskeletal: No neck pain, back pain Integumentary: No rash, pruritus, skin lesions Neurological: as above Psychiatric: Improved depression.  No anxiety Endocrine: No palpitations, diaphoresis, change in appetite, change in weigh or increased thirst Hematologic/Lymphatic: No anemia, purpura, petechiae. Allergic/Immunologic: No itchy/runny eyes, nasal congestion, recent allergic reactions, rashes  ALLERGIES: No Known Allergies  HOME MEDICATIONS:  Current Outpatient Prescriptions:  .  donepezil (ARICEPT) 10 MG tablet, TAKE 1 TABLET BY MOUTH EVERY DAY, Disp: 30 tablet, Rfl: 0 .  folic acid (FOLVITE) 1 MG tablet, Take 2 tablets (2 mg total) by mouth daily., Disp: 60 tablet, Rfl: 11 .  hydrOXYzine (ATARAX/VISTARIL) 25 MG tablet, Take  one-half tablet twice dialy as needed for agitation, Disp: 30 tablet, Rfl: 3 .  omeprazole (PRILOSEC) 40 MG capsule, Take 40 mg by mouth daily., Disp: , Rfl:  .  sertraline (ZOLOFT) 50 MG tablet, Take 1 tablet (50 mg total) by mouth daily., Disp: 30 tablet, Rfl: 11 .  sucralfate (CARAFATE) 1 G tablet, Take 1 g by mouth 3 (three) times daily., Disp: , Rfl: 3  PAST MEDICAL HISTORY: Past Medical History:  Diagnosis Date  . Coronary artery disease   . Memory loss   . Vision abnormalities     PAST SURGICAL HISTORY: Past Surgical History:  Procedure Laterality Date  . VESICOVAGINAL FISTULA CLOSURE W/ TAH      FAMILY HISTORY: Family History  Problem Relation Age of Onset  . Uterine cancer Mother   . Dementia Father     SOCIAL HISTORY:  Social History   Social History  . Marital status: Divorced    Spouse name: N/A  . Number of children: N/A  . Years of education: N/A   Occupational History  . Not on file.   Social History Main Topics  . Smoking status: Former Smoker    Types: Cigarettes  . Smokeless tobacco: Never Used  . Alcohol use No  . Drug use: No  . Sexual activity: Not on file   Other Topics Concern  . Not on file   Social History Narrative  . No narrative on file     PHYSICAL EXAM  Vitals:   01/01/17 1326  BP: 137/79  Pulse: 79  Resp: 17  Weight: 121 lb (54.9 kg)  Height: 5\' 7"  (1.702 m)    Body mass index is 18.95 kg/m.   General: The patient is well-developed and well-nourished and in no acute distress  Skin: Extremities are without significant edema.  Musculoskeletal:  Back is nontender  Neurologic Exam  Mental status: The patient is alert but oriented only to name and Lady Gary but not date. She knows birthdate May 07, 2040 but not how old   The patient had very poor recent (0/3 and 0/3 with category and 1/3 with list prompt) but good remote memory.   She had reduced attention span (WORLD - ?) .   Speech is normal.  Cranial nerves:  Extraocular movements are full.   Facial strength and sensation is normal..   Palatal elevation and tongue protrusion is midline. No obvious hearing deficits are noted.  Motor:  Muscle bulk is normal.   Tone is normal. Strength is  5 / 5 in all 4 extremities.   Sensory: Sensory testing is intact to touch and vibration sensation in all 4 extremities.  Coordination: Cerebellar testing reveals good finger-nose-finger  bilaterally.  Gait and station: Station is normal.   Gait is normal. Tandem gait is normal for age. Romberg is negative..   Reflexes: Deep tendon  reflexes are symmetric and normal bilaterally.        DIAGNOSTIC DATA (LABS, IMAGING, TESTING) - I reviewed patient records, labs, notes, testing and imaging myself where available.  Lab Results  Component Value Date   WBC 5.5 01/17/2014   HGB 10.8 (L) 01/17/2014   HCT 32.1 (L) 01/17/2014   MCV 95.3 01/17/2014   PLT 416 (H) 01/17/2014      Component Value Date/Time   NA 143 01/17/2014 1735   K 3.1 (L) 01/17/2014 1735   CL 101 01/17/2014 1735   CO2 21 01/17/2014 1735   GLUCOSE 83 01/17/2014 1735   BUN 13 01/17/2014 1735   CREATININE 0.71 01/17/2014 1735   CALCIUM 9.9 01/17/2014 1735   CALCIUM 10.5 06/12/2009 0455   PROT 6.9 01/17/2014 1735   ALBUMIN 3.7 01/17/2014 1735   AST 29 01/17/2014 1735   ALT 10 01/17/2014 1735   ALKPHOS 49 01/17/2014 1735   BILITOT 0.4 01/17/2014 1735   GFRNONAA 84 (L) 01/17/2014 1735   GFRAA >90 01/17/2014 1735   No results found for: CHOL, HDL, LDLCALC, LDLDIRECT, TRIG, CHOLHDL No results found for: HGBA1C Lab Results  Component Value Date   VITAMINB12 223 09/23/2014   Lab Results  Component Value Date   TSH 2.620 09/23/2014       ASSESSMENT AND PLAN  Late onset Alzheimer's disease without behavioral disturbance  Late onset Alzheimer's disease with behavioral disturbance  Depression with anxiety   1.  Continue donepezil 10 mg daily and add Namenda, titrate up to 28 mg.   Continue folic acid. 2.   Continue Zoloft 50 mg daily. 3.   I wrote a letter and filled out the FL2 to help get her into a memory care unit. 4.   She will return to see me in 12 months or sooner if there are new or worsening neurologic symptoms.   Richard A. Felecia Shelling, MD, PhD 44/05/4740, 5:95 PM Certified in Neurology, Clinical Neurophysiology, Sleep Medicine, Pain Medicine and Neuroimaging  [facility] [address], [place], [place] [address] [phone]

## 2017-01-06 ENCOUNTER — Other Ambulatory Visit: Payer: Self-pay | Admitting: Neurology

## 2017-03-30 ENCOUNTER — Other Ambulatory Visit: Payer: Self-pay | Admitting: Neurology

## 2017-04-12 ENCOUNTER — Other Ambulatory Visit: Payer: Self-pay

## 2017-04-12 ENCOUNTER — Emergency Department (HOSPITAL_COMMUNITY)
Admission: EM | Admit: 2017-04-12 | Discharge: 2017-04-12 | Disposition: A | Discharge status: Skilled Nursing Facility | Payer: Medicare (Managed Care) | Attending: Emergency Medicine | Admitting: Emergency Medicine

## 2017-04-12 ENCOUNTER — Encounter (HOSPITAL_COMMUNITY): Payer: Self-pay

## 2017-04-12 DIAGNOSIS — Z79899 Other long term (current) drug therapy: Secondary | ICD-10-CM | POA: Insufficient documentation

## 2017-04-12 DIAGNOSIS — Z87891 Personal history of nicotine dependence: Secondary | ICD-10-CM | POA: Diagnosis not present

## 2017-04-12 DIAGNOSIS — R1033 Periumbilical pain: Secondary | ICD-10-CM | POA: Diagnosis not present

## 2017-04-12 DIAGNOSIS — G309 Alzheimer's disease, unspecified: Secondary | ICD-10-CM | POA: Diagnosis not present

## 2017-04-12 DIAGNOSIS — H1033 Unspecified acute conjunctivitis, bilateral: Secondary | ICD-10-CM | POA: Diagnosis not present

## 2017-04-12 DIAGNOSIS — N390 Urinary tract infection, site not specified: Secondary | ICD-10-CM | POA: Diagnosis not present

## 2017-04-12 DIAGNOSIS — H109 Unspecified conjunctivitis: Secondary | ICD-10-CM

## 2017-04-12 DIAGNOSIS — H11423 Conjunctival edema, bilateral: Secondary | ICD-10-CM | POA: Diagnosis present

## 2017-04-12 LAB — URINALYSIS, ROUTINE W REFLEX MICROSCOPIC
Glucose, UA: NEGATIVE mg/dL
Ketones, ur: 15 mg/dL — AB
Nitrite: NEGATIVE
Protein, ur: 100 mg/dL — AB
Specific Gravity, Urine: >1.03 — ABNORMAL HIGH (ref 1.005–1.030)
pH: 6 (ref 5.0–8.0)

## 2017-04-12 LAB — CBC WITH DIFFERENTIAL/PLATELET
Basophils Absolute: 0 10*3/uL (ref 0.0–0.1)
Basophils Relative: 0 %
Eosinophils Absolute: 0 10*3/uL (ref 0.0–0.7)
Eosinophils Relative: 0 %
HCT: 46.1 % — ABNORMAL HIGH (ref 36.0–46.0)
Hemoglobin: 14.6 g/dL (ref 12.0–15.0)
Lymphocytes Relative: 28 %
Lymphs Abs: 2.3 10*3/uL (ref 0.7–4.0)
MCH: 28 pg (ref 26.0–34.0)
MCHC: 31.7 g/dL (ref 30.0–36.0)
MCV: 88.5 fL (ref 78.0–100.0)
Monocytes Absolute: 0.7 10*3/uL (ref 0.1–1.0)
Monocytes Relative: 9 %
Neutro Abs: 5.1 10*3/uL (ref 1.7–7.7)
Neutrophils Relative %: 63 %
Platelets: 289 10*3/uL (ref 150–400)
RBC: 5.21 MIL/uL — ABNORMAL HIGH (ref 3.87–5.11)
RDW: 14.7 % (ref 11.5–15.5)
WBC: 8.1 10*3/uL (ref 4.0–10.5)

## 2017-04-12 LAB — URINALYSIS, MICROSCOPIC (REFLEX)

## 2017-04-12 MED ORDER — ERYTHROMYCIN 5 MG/GM OP OINT
TOPICAL_OINTMENT | Freq: Once | OPHTHALMIC | Status: AC
  Administered 2017-04-12: 1 via OPHTHALMIC
  Filled 2017-04-12: qty 3.5

## 2017-04-12 MED ORDER — OXYCODONE-ACETAMINOPHEN 5-325 MG PO TABS
1.0000 | ORAL_TABLET | Freq: Once | ORAL | Status: AC
  Administered 2017-04-12: 1 via ORAL
  Filled 2017-04-12: qty 1

## 2017-04-12 MED ORDER — ERYTHROMYCIN 5 MG/GM OP OINT: TOPICAL_OINTMENT | OPHTHALMIC | 0 refills | Status: DC

## 2017-04-12 MED ORDER — CEPHALEXIN 500 MG PO CAPS
500.0000 mg | ORAL_CAPSULE | Freq: Once | ORAL | Status: AC
  Administered 2017-04-12: 500 mg via ORAL
  Filled 2017-04-12: qty 1

## 2017-04-12 MED ORDER — PREDNISONE 20 MG PO TABS
40.0000 mg | ORAL_TABLET | Freq: Once | ORAL | Status: AC
  Administered 2017-04-12: 40 mg via ORAL
  Filled 2017-04-12: qty 2

## 2017-04-12 MED ORDER — PREDNISONE 20 MG PO TABS: 40.0000 mg | ORAL_TABLET | Freq: Every day | ORAL | 0 refills | Status: DC

## 2017-04-12 MED ORDER — CEPHALEXIN 500 MG PO CAPS: 500.0000 mg | ORAL_CAPSULE | Freq: Three times a day (TID) | ORAL | 0 refills | Status: DC

## 2017-04-12 NOTE — ED Notes (Signed)
Bed: [facility]ALA Expected date:  Expected time:  Means of arrival:  Comments: 

## 2017-04-12 NOTE — ED Triage Notes (Addendum)
Pt from [facility]- c/o bilateral eye swelling and lower abd pain starting 2 days ago. Eye swelling began with left eye but now both are swollen. Pt denies trauma, pain on palpation, or vision changes. Denies N/V, chest pain, shortness of breath. Hx of dementia, pt at baseline per EMS

## 2017-04-12 NOTE — ED Notes (Signed)
Bed: WA08 Expected date:  Expected time:  Means of arrival:  Comments: EMS-eye swelling

## 2017-04-12 NOTE — Discharge Instructions (Signed)
Do not use any lotions/creams on your face aside from erythromycin ointment for your eyes.

## 2017-04-12 NOTE — ED Provider Notes (Signed)
[facility] DEPT Provider Note   CSN: [id number] Arrival date & time: 04/12/17  1314     History   Chief Complaint Chief Complaint  Patient presents with  . Facial Swelling  . Abdominal Pain    HPI Audrey Norris is a 77 y.o. female.  HPI   77 year old female presenting for evaluation of facial rash swelling.  Patient has a past history of dementia and is not the greatest historian.  Apparently skin changes are noted about 2 days ago.  Denies any pain but does have a sensation of eye swelling.  Tearing.  Denies any eye pain.  Denies any change in visual.  This is at the bedside.  She reports that she recently gave her some "cold cream" and she may have applied it to this area. No new exposures that she is aware of.  She has also been complaining of some abdominal pain recently although she denies this currently.  Past Medical History:  Diagnosis Date  . Coronary artery disease   . Memory loss   . Vision abnormalities     Patient Active Problem List   Diagnosis Date Noted  . Depression with anxiety 05/05/2015  . Alzheimer's disease 11/04/2014  . Vitamin D deficiency 11/02/2014  . Memory loss 09/23/2014  . Dementia 09/23/2014  . Weight loss, abnormal 09/23/2014    Past Surgical History:  Procedure Laterality Date  . VESICOVAGINAL FISTULA CLOSURE W/ TAH      OB History    No data available       Home Medications    Prior to Admission medications   Medication Sig Start Date End Date Taking? Authorizing Provider  donepezil (ARICEPT) 10 MG tablet TAKE 1 TABLET BY MOUTH EVERY DAY 03/30/17  Yes Sater, Nanine Means, MD  folic acid (FOLVITE) 1 MG tablet Take 2 tablets (2 mg total) by mouth daily. Patient not taking: Reported on 04/12/2017 11/03/15   Britt Bottom, MD  hydrOXYzine (ATARAX/VISTARIL) 25 MG tablet TAKE 1/2 TABLET BY MOUTH TWICE DAILY AS NEEDED FOR AGITATION Patient not taking: Reported on 04/12/2017 03/30/17   Britt Bottom,  MD  memantine (NAMENDA XR) 28 MG CP24 24 hr capsule Take 1 capsule (28 mg total) by mouth daily. 01/01/17   Sater, Nanine Means, MD  memantine (NAMENDA XR) 7 MG CP24 24 hr capsule For 5 d, take one pill daily po.   For the next 5 d, take 2 pills a day.    For the next 5 d take 3 pills a day. Then start the other script 01/01/17   Britt Bottom, MD  omeprazole (PRILOSEC) 40 MG capsule Take 40 mg by mouth daily.    [provider]  sertraline (ZOLOFT) 50 MG tablet TAKE 1 TABLET BY MOUTH EVERY DAY 03/30/17   Sater, Nanine Means, MD  sucralfate (CARAFATE) 1 G tablet Take 1 g by mouth 3 (three) times daily. 06/30/14   [provider]    Family History Family History  Problem Relation Age of Onset  . Uterine cancer Mother   . Dementia Father     Social History Social History   Tobacco Use  . Smoking status: Former Smoker    Types: Cigarettes  . Smokeless tobacco: Never Used  Substance Use Topics  . Alcohol use: No  . Drug use: No     Allergies   Patient has no known allergies.   Review of Systems Review of Systems  All systems reviewed and negative,  other than as noted in HPI.  Physical Exam Updated Vital Signs BP 114/82 (BP Location: Left Arm)   Pulse 73   Temp 97.7 F (36.5 C) (Oral)   Resp 14   SpO2 100%   Physical Exam  Constitutional: She appears well-developed and well-nourished. No distress.  HENT:  Head: Normocephalic and atraumatic.    Within the pictured area she has some mild edema.  Skin is dry and perhaps somewhat thickened.  No open sores.  No discrete lesions.  Bilateral conjunctivitis.  Pupils are equal round reactive to light.  She denies any photophobia.  Anterior chambers are clear.  Some tearing noted.  He is able to read my name on my scrubs from several feet away.   Eyes: Conjunctivae are normal. Right eye exhibits no discharge. Left eye exhibits no discharge.  Neck: Neck supple.  Cardiovascular: Normal rate, regular rhythm and  normal heart sounds. Exam reveals no gallop and no friction rub.  No murmur heard. Pulmonary/Chest: Effort normal and breath sounds normal. No respiratory distress.  Abdominal: Soft. She exhibits no distension. There is tenderness.  Suprapubic tenderness w/o rebound or guarding  Musculoskeletal: She exhibits no edema or tenderness.  Neurological: She is alert.  Skin: Skin is warm and dry.  Psychiatric: She has a normal mood and affect. Her behavior is normal. Thought content normal.  Nursing note and vitals reviewed.    ED Treatments / Results  Labs (all labs ordered are listed, but only abnormal results are displayed) Labs Reviewed  CBC WITH DIFFERENTIAL/PLATELET - Abnormal; Notable for the following components:      Result Value   RBC 5.21 (*)    HCT 46.1 (*)    All other components within normal limits  URINALYSIS, ROUTINE W REFLEX MICROSCOPIC - Abnormal; Notable for the following components:   APPearance TURBID (*)    Specific Gravity, Urine >1.030 (*)    Hgb urine dipstick MODERATE (*)    Bilirubin Urine MODERATE (*)    Ketones, ur 15 (*)    Protein, ur 100 (*)    Leukocytes, UA MODERATE (*)    All other components within normal limits  URINALYSIS, MICROSCOPIC (REFLEX) - Abnormal; Notable for the following components:   Bacteria, UA MANY (*)    Squamous Epithelial / LPF 6-30 (*)    All other components within normal limits  URINE CULTURE    EKG  EKG Interpretation None       Radiology No results found.  Procedures Procedures (including critical care time)  Medications Ordered in ED Medications  predniSONE (DELTASONE) tablet 40 mg (not administered)  erythromycin ophthalmic ointment (not administered)     Initial Impression / Assessment and Plan / ED Course  I have reviewed the triage vital signs and the nursing notes.  Pertinent labs & imaging results that were available during my care of the patient were reviewed by me and considered in my medical  decision making (see chart for details).    77 year old female with a periorbital rash and conjunctivitis.  I suspect that this may be a contact dermatitis.  She does have some tearing but no purulence.  Anterior chambers are clear.  She denies any eye pain or change in visual acuity.  Will cover with antibiotic eyedrops although I think that bacterial infection is less likely. She apparently was c/o abdominal pain recently although denies currently. Does have some mild suprapubic tenderness on exam though.  Will check basic labs/UA.  Doubt acute surgical process.  Problems obtaining labs.  I doubt of much utility.  She does have a UTI.  This would explain suprapubic tenderness.  She is afebrile.  Nontoxic.  Will place her on Keflex.  Will place him on erythromycin ointment and steroids for presumed contact dermatitis.  Final Clinical Impressions(s) / ED Diagnoses   Final diagnoses:  Conjunctivitis of both eyes, unspecified conjunctivitis type  Urinary tract infection without hematuria, site unspecified    ED Discharge Orders    None       Virgel Manifold, MD 04/12/17 2020

## 2017-04-12 NOTE — ED Notes (Addendum)
As pt was walking to the bathroom I told her we needed a urine sample and explained what she needed to do.  She repeated the instructions back and then she did not urinate in the specimen cup.  So I was unable to get a urine specimen.

## 2017-04-12 NOTE — ED Notes (Signed)
It was informed to this Probation officer by [employer] that lab stated they needed a recollect on lavender and light green blood tubes. I have had difficulties obtain lab work recollect. RN Edison Nasuti was informed when he called lab just now that only one lab had clotted, but the good sample was not resulted either. Still attempting obtain lab work

## 2017-04-12 NOTE — ED Notes (Signed)
Not enough urine obtained for culture to be obtained

## 2017-04-12 NOTE — ED Notes (Signed)
Called niece  Per pt's request to inform that pt was in the ED

## 2017-04-12 NOTE — ED Notes (Signed)
2x unsuccessful blood draw

## 2017-04-12 NOTE — ED Notes (Signed)
ptar called for transport

## 2017-04-14 LAB — URINE CULTURE: Culture: >=100000 — AB

## 2017-04-15 ENCOUNTER — Telehealth: Payer: Self-pay

## 2017-04-15 NOTE — Telephone Encounter (Signed)
No treatment for [facility] ED 04/12/17 Likely contaminant per Lenn Sink [facility]  PT sent home on Cephalexin

## 2018-01-01 ENCOUNTER — Ambulatory Visit: Payer: Medicare Other | Admitting: Neurology

## 2020-06-17 ENCOUNTER — Other Ambulatory Visit: Payer: Self-pay

## 2020-06-17 ENCOUNTER — Encounter (HOSPITAL_COMMUNITY): Payer: Self-pay

## 2020-06-17 ENCOUNTER — Emergency Department (HOSPITAL_COMMUNITY)
Admission: EM | Admit: 2020-06-17 | Discharge: 2020-06-17 | Disposition: A | Discharge status: Home / Self Care | Payer: Medicare Other | Attending: Emergency Medicine | Admitting: Emergency Medicine

## 2020-06-17 DIAGNOSIS — G309 Alzheimer's disease, unspecified: Secondary | ICD-10-CM | POA: Diagnosis not present

## 2020-06-17 DIAGNOSIS — I2510 Atherosclerotic heart disease of native coronary artery without angina pectoris: Secondary | ICD-10-CM | POA: Insufficient documentation

## 2020-06-17 DIAGNOSIS — R112 Nausea with vomiting, unspecified: Secondary | ICD-10-CM | POA: Insufficient documentation

## 2020-06-17 DIAGNOSIS — F0280 Dementia in other diseases classified elsewhere without behavioral disturbance: Secondary | ICD-10-CM | POA: Insufficient documentation

## 2020-06-17 DIAGNOSIS — Z79899 Other long term (current) drug therapy: Secondary | ICD-10-CM | POA: Insufficient documentation

## 2020-06-17 DIAGNOSIS — Z87891 Personal history of nicotine dependence: Secondary | ICD-10-CM | POA: Diagnosis not present

## 2020-06-17 DIAGNOSIS — R197 Diarrhea, unspecified: Secondary | ICD-10-CM | POA: Diagnosis not present

## 2020-06-17 DIAGNOSIS — R1110 Vomiting, unspecified: Secondary | ICD-10-CM

## 2020-06-17 MED ORDER — OXYCODONE-ACETAMINOPHEN 5-325 MG PO TABS: 1.0000 | ORAL_TABLET | ORAL | 0 refills | Status: DC | PRN

## 2020-06-17 MED ORDER — DOXYCYCLINE HYCLATE 100 MG PO CAPS: 100.0000 mg | ORAL_CAPSULE | Freq: Two times a day (BID) | ORAL | 0 refills | Status: DC

## 2020-06-17 NOTE — ED Notes (Signed)
Patient offered crackers, patient refused, Eulis Foster MD informed

## 2020-06-17 NOTE — ED Notes (Signed)
Patient resting in bed with eyes closed, no evidence of vomiting at this time

## 2020-06-17 NOTE — ED Triage Notes (Addendum)
Coming from [facility], NVD since last night, baseline orientation is only to self, patient began vomiting on way to ER with EMS

## 2020-06-17 NOTE — ED Notes (Signed)
Patient continues to rest in bed with eyes closed at this time

## 2020-06-17 NOTE — ED Notes (Signed)
Patient placed in new brief, moved to hallway for better observation

## 2020-06-17 NOTE — ED Notes (Signed)
Patient's family member to bedside, will take patient home

## 2020-06-17 NOTE — ED Provider Notes (Signed)
[facility] DEPT Provider Note   CSN: [id number] Arrival date & time: 06/17/20  0848     History Chief Complaint  Patient presents with  . Nausea  . Emesis  . Diarrhea    Audrey Norris is a 80 y.o. female.  HPI Patient seen by me at 9:30 AM.  She is resting comfortably in bed and states that nothing is bothering her.  She is unable to give any history.  Patient had been transferred earlier by EMS to be evaluated for nausea, vomiting and diarrhea which reportedly started yesterday.  There is no paperwork with the patient.  Level 5 caveat-dementia    Past Medical History:  Diagnosis Date  . Coronary artery disease   . Memory loss   . Vision abnormalities     Patient Active Problem List   Diagnosis Date Noted  . Depression with anxiety 05/05/2015  . Alzheimer's disease ([place]) 11/04/2014  . Vitamin D deficiency 11/02/2014  . Memory loss 09/23/2014  . Dementia ([place]) 09/23/2014  . Weight loss, abnormal 09/23/2014    Past Surgical History:  Procedure Laterality Date  . VESICOVAGINAL FISTULA CLOSURE W/ [facility]       OB History   No obstetric history on file.     Family History  Problem Relation Age of Onset  . Uterine cancer Mother   . Dementia Father     Social History   Tobacco Use  . Smoking status: Former Smoker    Types: Cigarettes  . Smokeless tobacco: Never Used  Substance Use Topics  . Alcohol use: No  . Drug use: No    Home Medications Prior to Admission medications   Medication Sig Start Date End Date Taking? Authorizing Provider  cephALEXin (KEFLEX) 500 MG capsule Take 1 capsule (500 mg total) by mouth 3 (three) times daily. 04/12/17   Virgel Manifold, MD  cholecalciferol (VITAMIN D) 1000 units tablet Take 1,000 Units by mouth daily.    [provider]  donepezil (ARICEPT) 10 MG tablet TAKE 1 TABLET BY MOUTH EVERY DAY 03/30/17   Sater, Nanine Means, MD  erythromycin ophthalmic ointment Place a 1/2 inch ribbon  of ointment into the lower eyelid. 04/12/17   Virgel Manifold, MD  folic acid (FOLVITE) 1 MG tablet Take 2 tablets (2 mg total) by mouth daily. Patient not taking: Reported on 04/12/2017 11/03/15   Britt Bottom, MD  hydrOXYzine (ATARAX/VISTARIL) 25 MG tablet TAKE 1/2 TABLET BY MOUTH TWICE DAILY AS NEEDED FOR AGITATION Patient not taking: Reported on 04/12/2017 03/30/17   Britt Bottom, MD  memantine (NAMENDA XR) 28 MG CP24 24 hr capsule Take 1 capsule (28 mg total) by mouth daily. Patient not taking: Reported on 04/12/2017 01/01/17   Britt Bottom, MD  memantine (NAMENDA XR) 7 MG CP24 24 hr capsule For 5 d, take one pill daily po.   For the next 5 d, take 2 pills a day.    For the next 5 d take 3 pills a day. Then start the other script Patient not taking: Reported on 04/12/2017 01/01/17   Britt Bottom, MD  Nutritional Supplements (NUTRITIONAL SHAKE PO) Take 1 Can by mouth daily as needed. MIGHTY SHAKES    [provider]  omeprazole (PRILOSEC) 40 MG capsule Take 40 mg by mouth daily.    [provider]  predniSONE (DELTASONE) 20 MG tablet Take 2 tablets (40 mg total) by mouth daily. 04/12/17   Virgel Manifold, MD  sertraline (ZOLOFT) 50  MG tablet TAKE 1 TABLET BY MOUTH EVERY DAY 03/30/17   Sater, Nanine Means, MD  sucralfate (CARAFATE) 1 G tablet Take 1 g by mouth 3 (three) times daily. 06/30/14   [provider]    Allergies    Patient has no known allergies.  Review of Systems   Review of Systems  Unable to perform ROS: Dementia    Physical Exam Updated Vital Signs There were no vitals taken for this visit.  Physical Exam Vitals and nursing note reviewed.  Constitutional:      Appearance: She is well-developed.  HENT:     Head: Normocephalic and atraumatic.     Right Ear: External ear normal.     Left Ear: External ear normal.  Eyes:     Conjunctiva/sclera: Conjunctivae normal.     Pupils: Pupils are equal, round, and reactive to light.  Neck:      Trachea: Phonation normal.  Cardiovascular:     Rate and Rhythm: Normal rate.  Pulmonary:     Effort: Pulmonary effort is normal. No respiratory distress.     Breath sounds: No stridor.  Abdominal:     General: There is no distension.     Palpations: Abdomen is soft.     Tenderness: There is no abdominal tenderness.  Musculoskeletal:        General: Normal range of motion.     Cervical back: Normal range of motion and neck supple.  Skin:    General: Skin is warm and dry.  Neurological:     Mental Status: She is alert.     Cranial Nerves: No cranial nerve deficit.     Sensory: No sensory deficit.     Motor: No abnormal muscle tone.     Coordination: Coordination normal.  Psychiatric:        Mood and Affect: Mood normal.        Behavior: Behavior normal.     ED Results / Procedures / Treatments   Labs (all labs ordered are listed, but only abnormal results are displayed) Labs Reviewed - No data to display  EKG None  Radiology No results found.  Procedures Procedures   Medications Ordered in ED Medications - No data to display  ED Course  I have reviewed the triage vital signs and the nursing notes.  Pertinent labs & imaging results that were available during my care of the patient were reviewed by me and considered in my medical decision making (see chart for details).    MDM Rules/Calculators/A&P                           Patient Vitals for the past 24 hrs:  BP Temp Temp src Pulse Resp SpO2  06/17/20 0902 (!) 169/68 97.8 F (36.6 C) Oral 81 16 99 %    2:51 PM Reevaluation with update and discussion. After initial assessment and treatment, an updated evaluation reveals she is tolerating markers without vomiting.  She refused crackers.  I attempted to reach her listed contact but no one answered the phone.Daleen Bo   Medical Decision Making:  This patient is presenting for evaluation of reported vomiting, which does not require a range of treatment  options, and is not a complaint that involves a high risk of morbidity and mortality. The differential diagnoses include choking, vomiting, medication reaction. I decided to review old records, and in summary elderly female presenting with reported vomiting.  Limited information available, patient unable  to contribute to history.  I attempted to get additional historical information from family member by telephone.    Critical Interventions-clinical evaluation, observation  After These Interventions, the Patient was reevaluated and was found to tolerate liquids, without vomiting.  CRITICAL CARE-no Performed by: Daleen Bo  Nursing Notes Reviewed/ Care Coordinated Applicable Imaging Reviewed Interpretation of Laboratory Data incorporated into ED treatment  The patient appears reasonably screened and/or stabilized for discharge and I doubt any other medical condition or other [facility] requiring further screening, evaluation, or treatment in the ED at this time prior to discharge.  Plan: Home Medications-continue usual medications; Home Treatments-gradually advance diet; return here if the recommended treatment, does not improve the symptoms; Recommended follow up-PCP, as needed     Final Clinical Impression(s) / ED Diagnoses Final diagnoses:  Non-intractable vomiting, presence of nausea not specified, unspecified vomiting type    Rx / DC Orders ED Discharge Orders    None       Daleen Bo, MD 06/18/20 [phone]

## 2020-06-17 NOTE — ED Notes (Signed)
Spoke with [facility], they will arrange transport for patient back to [facility]

## 2020-06-17 NOTE — Discharge Instructions (Addendum)
Start with a clear liquid diet, and gradually advance to regular foods over a day or 2.  Follow-up with your doctor if not better in 1 or 2 days.

## 2020-06-17 NOTE — ED Notes (Signed)
Patient given water for po challenge

## 2020-07-16 ENCOUNTER — Inpatient Hospital Stay (HOSPITAL_BASED_OUTPATIENT_CLINIC_OR_DEPARTMENT_OTHER)
Admission: EM | Admit: 2020-07-16 | Discharge: 2020-07-22 | DRG: 167 | Disposition: A | Discharge status: Skilled Nursing Facility | Payer: Medicare Other | Source: Skilled Nursing Facility | Attending: Internal Medicine | Admitting: Internal Medicine

## 2020-07-16 ENCOUNTER — Other Ambulatory Visit: Payer: Self-pay

## 2020-07-16 ENCOUNTER — Encounter (HOSPITAL_BASED_OUTPATIENT_CLINIC_OR_DEPARTMENT_OTHER): Payer: Self-pay

## 2020-07-16 DIAGNOSIS — G309 Alzheimer's disease, unspecified: Secondary | ICD-10-CM | POA: Diagnosis present

## 2020-07-16 DIAGNOSIS — D649 Anemia, unspecified: Secondary | ICD-10-CM | POA: Diagnosis present

## 2020-07-16 DIAGNOSIS — I2510 Atherosclerotic heart disease of native coronary artery without angina pectoris: Secondary | ICD-10-CM | POA: Diagnosis present

## 2020-07-16 DIAGNOSIS — F0390 Unspecified dementia without behavioral disturbance: Secondary | ICD-10-CM | POA: Diagnosis present

## 2020-07-16 DIAGNOSIS — C3431 Malignant neoplasm of lower lobe, right bronchus or lung: Secondary | ICD-10-CM | POA: Diagnosis not present

## 2020-07-16 DIAGNOSIS — J439 Emphysema, unspecified: Secondary | ICD-10-CM | POA: Diagnosis present

## 2020-07-16 DIAGNOSIS — Z20822 Contact with and (suspected) exposure to covid-19: Secondary | ICD-10-CM | POA: Diagnosis present

## 2020-07-16 DIAGNOSIS — Z79899 Other long term (current) drug therapy: Secondary | ICD-10-CM

## 2020-07-16 DIAGNOSIS — R636 Underweight: Secondary | ICD-10-CM | POA: Diagnosis present

## 2020-07-16 DIAGNOSIS — F0280 Dementia in other diseases classified elsewhere without behavioral disturbance: Secondary | ICD-10-CM | POA: Diagnosis present

## 2020-07-16 DIAGNOSIS — Z419 Encounter for procedure for purposes other than remedying health state, unspecified: Secondary | ICD-10-CM

## 2020-07-16 DIAGNOSIS — R64 Cachexia: Secondary | ICD-10-CM | POA: Diagnosis present

## 2020-07-16 DIAGNOSIS — R599 Enlarged lymph nodes, unspecified: Secondary | ICD-10-CM

## 2020-07-16 DIAGNOSIS — D509 Iron deficiency anemia, unspecified: Secondary | ICD-10-CM | POA: Diagnosis present

## 2020-07-16 DIAGNOSIS — Z515 Encounter for palliative care: Secondary | ICD-10-CM

## 2020-07-16 DIAGNOSIS — D72829 Elevated white blood cell count, unspecified: Secondary | ICD-10-CM | POA: Diagnosis present

## 2020-07-16 DIAGNOSIS — Z681 Body mass index (BMI) 19 or less, adult: Secondary | ICD-10-CM

## 2020-07-16 DIAGNOSIS — R918 Other nonspecific abnormal finding of lung field: Secondary | ICD-10-CM

## 2020-07-16 DIAGNOSIS — Z66 Do not resuscitate: Secondary | ICD-10-CM | POA: Diagnosis present

## 2020-07-16 DIAGNOSIS — Z87891 Personal history of nicotine dependence: Secondary | ICD-10-CM

## 2020-07-16 DIAGNOSIS — E860 Dehydration: Secondary | ICD-10-CM | POA: Diagnosis present

## 2020-07-16 DIAGNOSIS — Z7982 Long term (current) use of aspirin: Secondary | ICD-10-CM

## 2020-07-16 LAB — CBC WITH DIFFERENTIAL/PLATELET
Abs Immature Granulocytes: 0.06 10*3/uL (ref 0.00–0.07)
Basophils Absolute: 0 10*3/uL (ref 0.0–0.1)
Basophils Relative: 0 %
Eosinophils Absolute: 0.1 10*3/uL (ref 0.0–0.5)
Eosinophils Relative: 1 %
HCT: 19.9 % — ABNORMAL LOW (ref 36.0–46.0)
Hemoglobin: 4.8 g/dL — CL (ref 12.0–15.0)
Immature Granulocytes: 1 %
Lymphocytes Relative: 16 %
Lymphs Abs: 1.9 10*3/uL (ref 0.7–4.0)
MCH: 13.7 pg — ABNORMAL LOW (ref 26.0–34.0)
MCHC: 24.1 g/dL — ABNORMAL LOW (ref 30.0–36.0)
MCV: 56.7 fL — ABNORMAL LOW (ref 80.0–100.0)
Monocytes Absolute: 1.3 10*3/uL — ABNORMAL HIGH (ref 0.1–1.0)
Monocytes Relative: 12 %
Neutro Abs: 8.1 10*3/uL — ABNORMAL HIGH (ref 1.7–7.7)
Neutrophils Relative %: 70 %
Platelets: 698 10*3/uL — ABNORMAL HIGH (ref 150–400)
RBC: 3.51 MIL/uL — ABNORMAL LOW (ref 3.87–5.11)
RDW: 24.8 % — ABNORMAL HIGH (ref 11.5–15.5)
WBC: 11.5 10*3/uL — ABNORMAL HIGH (ref 4.0–10.5)
nRBC: 0.7 % — ABNORMAL HIGH (ref 0.0–0.2)

## 2020-07-16 LAB — COMPREHENSIVE METABOLIC PANEL
ALT: 7 U/L (ref 0–44)
AST: 12 U/L — ABNORMAL LOW (ref 15–41)
Albumin: 3.5 g/dL (ref 3.5–5.0)
Alkaline Phosphatase: 67 U/L (ref 38–126)
Anion gap: 8 (ref 5–15)
BUN: 13 mg/dL (ref 8–23)
CO2: 25 mmol/L (ref 22–32)
Calcium: 9.4 mg/dL (ref 8.9–10.3)
Chloride: 109 mmol/L (ref 98–111)
Creatinine, Ser: 0.79 mg/dL (ref 0.44–1.00)
GFR, Estimated: >60 mL/min (ref >60)
Glucose, Bld: 99 mg/dL (ref 70–99)
Potassium: 4.3 mmol/L (ref 3.5–5.1)
Sodium: 142 mmol/L (ref 135–145)
Total Bilirubin: 0.3 mg/dL (ref 0.3–1.2)
Total Protein: 6.6 g/dL (ref 6.5–8.1)

## 2020-07-16 LAB — TROPONIN I (HIGH SENSITIVITY): Troponin I (High Sensitivity): 4 ng/L (ref <18)

## 2020-07-16 MED ORDER — HALOPERIDOL LACTATE 5 MG/ML IJ SOLN
2.0000 mg | Freq: Once | INTRAMUSCULAR | Status: AC
  Administered 2020-07-16: 2 mg via INTRAMUSCULAR
  Filled 2020-07-16: qty 1

## 2020-07-16 NOTE — ED Notes (Signed)
EDP Long made aware of Critical Hemoglobin Level. No New Orders at this Time.

## 2020-07-16 NOTE — ED Provider Notes (Signed)
Emergency Department Provider Note   I have reviewed the triage vital signs and the nursing notes.   HISTORY  Chief Complaint Weakness   HPI Audrey Norris is a 80 y.o. female with past medical history reviewed below including dementia from a skilled nursing facility presents to the emergency department by EMS.  She is at her mental status baseline but according to staff was found seated on the ground and would not get up.  Staff question some foul urine odor.  Denies any aggressive behaviors.  Patient tells me she is feeling well and is not sure why she is here.  Level 5 caveat applies with dementia.    Past Medical History:  Diagnosis Date  . Coronary artery disease   . Memory loss   . Vision abnormalities     Patient Active Problem List   Diagnosis Date Noted  . Symptomatic anemia 07/16/2020  . Depression with anxiety 05/05/2015  . Alzheimer's disease ([place]) 11/04/2014  . Vitamin D deficiency 11/02/2014  . Memory loss 09/23/2014  . Dementia ([place]) 09/23/2014  . Weight loss, abnormal 09/23/2014    Past Surgical History:  Procedure Laterality Date  . VESICOVAGINAL FISTULA CLOSURE W/ [facility]      Allergies Patient has no known allergies.  Family History  Problem Relation Age of Onset  . Uterine cancer Mother   . Dementia Father     Social History Social History   Tobacco Use  . Smoking status: Former Smoker    Types: Cigarettes  . Smokeless tobacco: Never Used  Substance Use Topics  . Alcohol use: No  . Drug use: No    Review of Systems  Level 5 caveat: Dementia   ____________________________________________   PHYSICAL EXAM:  VITAL SIGNS: ED Triage Vitals  Enc Vitals Group     BP 07/16/20 2159 131/69     Pulse Rate 07/16/20 2159 76     Resp 07/16/20 2159 (!) 2     Temp 07/16/20 2159 98.6 F (37 C)     Temp Source 07/16/20 2159 Oral     SpO2 07/16/20 2159 100 %     Weight 07/16/20 2200 121 lb 4.1 oz (55 kg)     Height 07/16/20 2200 5\' 7"   (1.702 m)   Constitutional: Alert but confused. Well appearing and in no acute distress. Eyes: Conjunctivae are normal.  Head: Atraumatic. Nose: No congestion/rhinnorhea. Mouth/Throat: Mucous membranes are moist. Neck: No stridor.  Cardiovascular: Normal rate, regular rhythm. Good peripheral circulation. Grossly normal heart sounds.   Respiratory: Normal respiratory effort.  No retractions. Lungs CTAB. Gastrointestinal: Soft and nontender. No distention. Rectal exam performed with nurse chaperone. No gross blood or melena.  Musculoskeletal: No gross deformities of extremities. Neurologic:  Normal speech and language. No gross focal neurologic deficits are appreciated.  Skin:  Skin is warm, dry and intact. No rash noted.  ____________________________________________   LABS (all labs ordered are listed, but only abnormal results are displayed)  Labs Reviewed  COMPREHENSIVE METABOLIC PANEL - Abnormal; Notable for the following components:      Result Value   AST 12 (*)    All other components within normal limits  CBC WITH DIFFERENTIAL/PLATELET - Abnormal; Notable for the following components:   WBC 11.5 (*)    RBC 3.51 (*)    Hemoglobin 4.8 (*)    HCT 19.9 (*)    MCV 56.7 (*)    MCH 13.7 (*)    MCHC 24.1 (*)    RDW 24.8 (*)  Platelets 698 (*)    nRBC 0.7 (*)    Neutro Abs 8.1 (*)    Monocytes Absolute 1.3 (*)    All other components within normal limits  URINE CULTURE  RESP PANEL BY RT-PCR (FLU A&B, COVID) ARPGX2  URINALYSIS, ROUTINE W REFLEX MICROSCOPIC  VITAMIN B12  FOLATE  IRON AND TIBC  FERRITIN  RETICULOCYTES  POC OCCULT BLOOD, ED  TROPONIN I (HIGH SENSITIVITY)  TROPONIN I (HIGH SENSITIVITY)   ____________________________________________  EKG   EKG Interpretation  Date/Time:  Friday July 16 2020 22:31:46 EDT Ventricular Rate:  103 PR Interval:  115 QRS Duration: 76 QT Interval:  343 QTC Calculation: 449 R Axis:   45 Text Interpretation: Sinus  tachycardia Repol abnrm, severe global ischemia (LM/MVD) Confirmed by Nanda Quinton [id number]) on 07/16/2020 10:33:41 PM      ____________________________________________   PROCEDURES  Procedure(s) performed:   Procedures  CRITICAL CARE Performed by: Margette Fast Total critical care time: 35 minutes Critical care time was exclusive of separately billable procedures and treating other patients. Critical care was necessary to treat or prevent imminent or life-threatening deterioration. Critical care was time spent personally by me on the following activities: development of treatment plan with patient and/or surrogate as well as nursing, discussions with consultants, evaluation of patient's response to treatment, examination of patient, obtaining history from patient or surrogate, ordering and performing treatments and interventions, ordering and review of laboratory studies, ordering and review of radiographic studies, pulse oximetry and re-evaluation of patient's condition.  Nanda Quinton, MD Emergency Medicine  ____________________________________________   INITIAL IMPRESSION / ASSESSMENT AND PLAN / ED COURSE  Pertinent labs & imaging results that were available during my care of the patient were reviewed by me and considered in my medical decision making (see chart for details).   Patient presents to the emergency department for evaluation of question of some weakness.  Her vital signs here are unremarkable.  She appears to be at her mental status baseline.  I do not appreciate any neurologic deficits.  EKG shows some ST depressions diffusely.  Patient denies any active pain although history is limited by her dementia.  Will obtain screening troponin as well as lab work and UA.   Spoke with Barbaraann Share after patient's CBC came back with hemoglobin of 4.8.  Lab work reviewed from 2019 with hemoglobin of 14 at that time.  No known history of anemia or blood transfusion per family.  She is not  anticoagulated.  Rectal exam shows brown stool with no gross blood or melena.  Will send for Hemoccult along with anemia panel.  Discussed with Barbaraann Share regarding the risk/benefit of blood transfusion and she provides verbal consent over the phone for the patient to receive this.   Discussed patient's case with [facility] to request admission. Patient and family (if present) updated with plan. Care transferred to [facility] service.  I reviewed all nursing notes, vitals, pertinent old records, EKGs, labs, imaging (as available).  ____________________________________________  FINAL CLINICAL IMPRESSION(S) / ED DIAGNOSES  Final diagnoses:  Symptomatic anemia    MEDICATIONS GIVEN DURING THIS VISIT:  Medications  haloperidol lactate (HALDOL) injection 2 mg (2 mg Intramuscular Given 07/16/20 2320)    Note:  This document was prepared using Dragon voice recognition software and may include unintentional dictation errors.  Nanda Quinton, MD, [facility] Emergency Medicine    Dede Dobesh, [facility], MD 07/17/20 [phone]

## 2020-07-16 NOTE — ED Triage Notes (Signed)
Patient BIB [facility] from [facility].  Nursing Staff noted patient had been behaving her normal self but had been weak throughout the day.   Staff called EMS when patient did not want to get up for staff. No complaints per Patient (Baseline Dementia).  Demented at Baseline. No Neuro Deficits. Staff did note Foul Smelling Urine However.   VSS with EMS.  CBG 139 with EMS

## 2020-07-17 ENCOUNTER — Inpatient Hospital Stay (HOSPITAL_COMMUNITY): Payer: Medicare Other

## 2020-07-17 ENCOUNTER — Encounter (HOSPITAL_COMMUNITY): Payer: Self-pay | Admitting: Internal Medicine

## 2020-07-17 DIAGNOSIS — G301 Alzheimer's disease with late onset: Secondary | ICD-10-CM | POA: Diagnosis not present

## 2020-07-17 DIAGNOSIS — D649 Anemia, unspecified: Secondary | ICD-10-CM

## 2020-07-17 DIAGNOSIS — R599 Enlarged lymph nodes, unspecified: Secondary | ICD-10-CM | POA: Diagnosis not present

## 2020-07-17 DIAGNOSIS — Z20822 Contact with and (suspected) exposure to covid-19: Secondary | ICD-10-CM | POA: Diagnosis present

## 2020-07-17 DIAGNOSIS — Z66 Do not resuscitate: Secondary | ICD-10-CM | POA: Diagnosis present

## 2020-07-17 DIAGNOSIS — D72829 Elevated white blood cell count, unspecified: Secondary | ICD-10-CM | POA: Diagnosis present

## 2020-07-17 DIAGNOSIS — E860 Dehydration: Secondary | ICD-10-CM | POA: Diagnosis present

## 2020-07-17 DIAGNOSIS — J439 Emphysema, unspecified: Secondary | ICD-10-CM | POA: Diagnosis present

## 2020-07-17 DIAGNOSIS — Z87891 Personal history of nicotine dependence: Secondary | ICD-10-CM | POA: Diagnosis not present

## 2020-07-17 DIAGNOSIS — Z7982 Long term (current) use of aspirin: Secondary | ICD-10-CM | POA: Diagnosis not present

## 2020-07-17 DIAGNOSIS — F0281 Dementia in other diseases classified elsewhere with behavioral disturbance: Secondary | ICD-10-CM | POA: Diagnosis not present

## 2020-07-17 DIAGNOSIS — R636 Underweight: Secondary | ICD-10-CM | POA: Diagnosis present

## 2020-07-17 DIAGNOSIS — R918 Other nonspecific abnormal finding of lung field: Secondary | ICD-10-CM | POA: Diagnosis not present

## 2020-07-17 DIAGNOSIS — Z681 Body mass index (BMI) 19 or less, adult: Secondary | ICD-10-CM | POA: Diagnosis not present

## 2020-07-17 DIAGNOSIS — C3431 Malignant neoplasm of lower lobe, right bronchus or lung: Secondary | ICD-10-CM | POA: Diagnosis present

## 2020-07-17 DIAGNOSIS — R64 Cachexia: Secondary | ICD-10-CM | POA: Diagnosis present

## 2020-07-17 DIAGNOSIS — Z515 Encounter for palliative care: Secondary | ICD-10-CM | POA: Diagnosis not present

## 2020-07-17 DIAGNOSIS — F0280 Dementia in other diseases classified elsewhere without behavioral disturbance: Secondary | ICD-10-CM | POA: Diagnosis present

## 2020-07-17 DIAGNOSIS — D509 Iron deficiency anemia, unspecified: Secondary | ICD-10-CM | POA: Diagnosis present

## 2020-07-17 DIAGNOSIS — G309 Alzheimer's disease, unspecified: Secondary | ICD-10-CM | POA: Diagnosis present

## 2020-07-17 DIAGNOSIS — Z79899 Other long term (current) drug therapy: Secondary | ICD-10-CM | POA: Diagnosis not present

## 2020-07-17 DIAGNOSIS — I2510 Atherosclerotic heart disease of native coronary artery without angina pectoris: Secondary | ICD-10-CM | POA: Diagnosis present

## 2020-07-17 LAB — RETICULOCYTES
Immature Retic Fract: 29.6 % — ABNORMAL HIGH (ref 2.3–15.9)
RBC.: 3.54 MIL/uL — ABNORMAL LOW (ref 3.87–5.11)
Retic Count, Absolute: 41.1 10*3/uL (ref 19.0–186.0)
Retic Ct Pct: 1.2 % (ref 0.4–3.1)

## 2020-07-17 LAB — ABO/RH: ABO/RH(D): O POS

## 2020-07-17 LAB — OCCULT BLOOD X 1 CARD TO LAB, STOOL: Fecal Occult Bld: NEGATIVE

## 2020-07-17 LAB — PREPARE RBC (CROSSMATCH)

## 2020-07-17 LAB — RESP PANEL BY RT-PCR (FLU A&B, COVID) ARPGX2
Influenza A by PCR: NEGATIVE
Influenza B by PCR: NEGATIVE
SARS Coronavirus 2 by RT PCR: NEGATIVE

## 2020-07-17 LAB — VITAMIN B12: Vitamin B-12: 319 pg/mL (ref 180–914)

## 2020-07-17 LAB — TROPONIN I (HIGH SENSITIVITY): Troponin I (High Sensitivity): 5 ng/L (ref <18)

## 2020-07-17 LAB — IRON AND TIBC
Iron: 13 ug/dL — ABNORMAL LOW (ref 28–170)
Saturation Ratios: 3 % — ABNORMAL LOW (ref 10.4–31.8)
TIBC: 456 ug/dL — ABNORMAL HIGH (ref 250–450)
UIBC: 443 ug/dL

## 2020-07-17 LAB — FERRITIN: Ferritin: 4 ng/mL — ABNORMAL LOW (ref 11–307)

## 2020-07-17 LAB — FOLATE: Folate: 8.4 ng/mL (ref >5.9)

## 2020-07-17 MED ORDER — LORAZEPAM 2 MG/ML IJ SOLN
1.0000 mg | Freq: Four times a day (QID) | INTRAMUSCULAR | Status: DC | PRN
  Administered 2020-07-17 – 2020-07-21 (×8): 1 mg via INTRAVENOUS
  Filled 2020-07-17 (×9): qty 1

## 2020-07-17 MED ORDER — ACETAMINOPHEN 500 MG PO TABS
500.0000 mg | ORAL_TABLET | Freq: Four times a day (QID) | ORAL | Status: DC | PRN
  Administered 2020-07-17 – 2020-07-21 (×2): 500 mg via ORAL
  Filled 2020-07-17 (×2): qty 1

## 2020-07-17 MED ORDER — DIVALPROEX SODIUM 125 MG PO CSDR
125.0000 mg | DELAYED_RELEASE_CAPSULE | Freq: Two times a day (BID) | ORAL | Status: DC
  Administered 2020-07-17 – 2020-07-21 (×9): 125 mg via ORAL
  Filled 2020-07-17 (×12): qty 1

## 2020-07-17 MED ORDER — IOHEXOL 300 MG/ML  SOLN
75.0000 mL | Freq: Once | INTRAMUSCULAR | Status: AC | PRN
  Administered 2020-07-17: 75 mL via INTRAVENOUS

## 2020-07-17 MED ORDER — SERTRALINE HCL 50 MG PO TABS
50.0000 mg | ORAL_TABLET | Freq: Every day | ORAL | Status: DC
  Administered 2020-07-17 – 2020-07-21 (×4): 50 mg via ORAL
  Filled 2020-07-17 (×5): qty 1

## 2020-07-17 MED ORDER — SODIUM CHLORIDE 0.9% IV SOLUTION: Freq: Once | INTRAVENOUS | Status: AC

## 2020-07-17 MED ORDER — LOPERAMIDE HCL 2 MG PO CAPS: 2.0000 mg | ORAL_CAPSULE | ORAL | Status: DC | PRN

## 2020-07-17 MED ORDER — RISPERIDONE 0.5 MG PO TABS
0.5000 mg | ORAL_TABLET | Freq: Two times a day (BID) | ORAL | Status: DC
  Administered 2020-07-17 – 2020-07-21 (×8): 0.5 mg via ORAL
  Filled 2020-07-17 (×8): qty 1

## 2020-07-17 MED ORDER — SUCRALFATE 1 G PO TABS
1.0000 g | ORAL_TABLET | Freq: Three times a day (TID) | ORAL | Status: DC
  Administered 2020-07-17 – 2020-07-21 (×11): 1 g via ORAL
  Filled 2020-07-17 (×12): qty 1

## 2020-07-17 NOTE — ED Notes (Signed)
Called [facility] to transport patient to [place]

## 2020-07-17 NOTE — ED Notes (Signed)
Pt unable to consent to transfer due to cognitive decline. Pt niece who makes medical decisions consents for the pt for transfer and for administration of blood products.

## 2020-07-17 NOTE — H&P (Signed)
History and Physical    Audrey Norris GQQ:[id number] DOB: 1940/08/15 DOA: 07/16/2020  PCP: [name], MD (Inactive)  Patient coming from: [place].  History obtained from patient's niece Audrey Norris.  Patient is demented.  Chief Complaint: Weakness.  HPI: Audrey Norris is a 80 y.o. female with history of advanced dementia was brought to the ER at [facility] for weakness.  As per the patient's niece patient had some diarrhea and abdominal discomfort about 3 weeks ago and was taken to the ER and work-up at that time was showing likely viral gastroenteritis as per the niece.  Today when patient was feeling weak patient was brought to the ER.  ED Course: In the ER patient's hemoglobin was found to be around 4.8 and microcytic hypochromic picture.  High sensitive troponin was 4 and stool for occult blood was negative.  Given the severe anemia patient admitted for blood transfusion.  Anemia panel is pending.  COVID test was negative.  Review of Systems: As per HPI, rest all negative.   Past Medical History:  Diagnosis Date  . Coronary artery disease   . Memory loss   . Vision abnormalities     Past Surgical History:  Procedure Laterality Date  . VESICOVAGINAL FISTULA CLOSURE W/ TAH       reports that she has quit smoking. Her smoking use included cigarettes. She has never used smokeless tobacco. She reports that she does not drink alcohol and does not use drugs.  No Known Allergies  Family History  Problem Relation Age of Onset  . Uterine cancer Mother   . Dementia Father     Prior to Admission medications   Medication Sig Start Date End Date Taking? Authorizing Provider  acetaminophen (TYLENOL) 500 MG tablet Take 500 mg by mouth every 6 (six) hours as needed for mild pain, fever or headache.    [provider]  aluminum-magnesium hydroxide-simethicone (MAALOX) 671-245-80 MG/5ML SUSP Take 30 mLs by mouth every 6 (six) hours  as needed (indigestion/heartburn).    [provider]  aspirin 81 MG chewable tablet Chew 81 mg by mouth daily.    [provider]  cholecalciferol (VITAMIN D) 1000 units tablet Take 1,000 Units by mouth daily.    [provider]  divalproex (DEPAKOTE SPRINKLE) 125 MG capsule Take 125 mg by mouth 2 (two) times daily. 05/03/20   [provider]  donepezil (ARICEPT) 10 MG tablet TAKE 1 TABLET BY MOUTH EVERY DAY Patient taking differently: Take 10 mg by mouth daily. 03/30/17   Sater, Nanine Means, MD  folic acid (FOLVITE) 1 MG tablet Take 2 tablets (2 mg total) by mouth daily. Patient not taking: No sig reported 11/03/15   Sater, Nanine Means, MD  guaiFENesin (ROBITUSSIN) 100 MG/5ML liquid Take 200 mg by mouth every 6 (six) hours as needed for cough.    [provider]  loperamide (IMODIUM) 2 MG capsule Take 2 mg by mouth as needed for diarrhea or loose stools.    [provider]  magnesium hydroxide (MILK OF MAGNESIA) 400 MG/5ML suspension Take 30 mLs by mouth at bedtime as needed for mild constipation.    [provider]  memantine (NAMENDA XR) 28 MG CP24 24 hr capsule Take 1 capsule (28 mg total) by mouth daily. Patient not taking: No sig reported 01/01/17   Britt Bottom, MD  memantine (NAMENDA XR) 7 MG CP24 24 hr capsule For 5 d, take one pill daily po.  For the next 5 d, take 2 pills a day.    For the next 5 d take 3 pills a day. Then start the other script Patient not taking: No sig reported 01/01/17   Sater, Nanine Means, MD  neomycin-bacitracin-polymyxin (NEOSPORIN) OINT Apply 1 application topically as needed for wound care.    [provider]  potassium chloride (KLOR-CON) 20 MEQ packet Take 40 mEq by mouth daily. Mix 2 packets with liquid 05/17/20   [provider]  risperiDONE (RISPERDAL) 0.5 MG tablet Take 0.5 mg by mouth 2 (two) times daily. 05/10/20   [provider]  sertraline (ZOLOFT) 50 MG tablet TAKE 1  TABLET BY MOUTH EVERY DAY Patient taking differently: Take 50 mg by mouth daily. 03/30/17   Sater, Nanine Means, MD  sucralfate (CARAFATE) 1 G tablet Take 1 g by mouth 3 (three) times daily. 06/30/14   [provider]    Physical Exam: Constitutional: Moderately built and nourished. Vitals:   07/16/20 2200 07/17/20 0300 07/17/20 0338 07/17/20 0433  BP:  (!) 146/58 (!) 153/60 (!) 136/56  Pulse:  72 74 67  Resp:  16 12 20   Temp:      TempSrc:      SpO2:  99% 98% 93%  Weight: 55 kg     Height: 5\' 7"  (1.702 m)      Eyes: Anicteric no pallor. ENMT: No discharge from the ears eyes nose and mouth. Neck: No mass felt.  No neck rigidity. Respiratory: No rhonchi or crepitations. Cardiovascular: S1-S2 heard. Abdomen: Soft nontender bowel sounds present. Musculoskeletal: No edema. Skin: No rash. Neurologic: Alert awake oriented to name.  Moving all extremities. Psychiatric: Has advanced dementia.   Labs on Admission: I have personally reviewed following labs and imaging studies  CBC: Recent Labs  Lab 07/16/20 2215  WBC 11.5*  NEUTROABS 8.1*  HGB 4.8*  HCT 19.9*  MCV 56.7*  PLT 098*   Basic Metabolic Panel: Recent Labs  Lab 07/16/20 2215  NA 142  K 4.3  CL 109  CO2 25  GLUCOSE 99  BUN 13  CREATININE 0.79  CALCIUM 9.4   GFR: Estimated Creatinine Clearance: 49.5 mL/min (by C-G formula based on SCr of 0.79 mg/dL). Liver Function Tests: Recent Labs  Lab 07/16/20 2215  AST 12*  ALT 7  ALKPHOS 67  BILITOT 0.3  PROT 6.6  ALBUMIN 3.5   No results for input(s): LIPASE, AMYLASE in the last 168 hours. No results for input(s): AMMONIA in the last 168 hours. Coagulation Profile: No results for input(s): INR, PROTIME in the last 168 hours. Cardiac Enzymes: No results for input(s): CKTOTAL, CKMB, CKMBINDEX, TROPONINI in the last 168 hours. BNP (last 3 results) No results for input(s): PROBNP in the last 8760 hours. HbA1C: No results for input(s): HGBA1C in the  last 72 hours. CBG: No results for input(s): GLUCAP in the last 168 hours. Lipid Profile: No results for input(s): CHOL, HDL, LDLCALC, TRIG, CHOLHDL, LDLDIRECT in the last 72 hours. Thyroid Function Tests: No results for input(s): TSH, T4TOTAL, FREET4, T3FREE, THYROIDAB in the last 72 hours. Anemia Panel: Recent Labs    07/16/20 2320  RETICCTPCT 1.2   Urine analysis:    Component Value Date/Time   COLORURINE YELLOW 04/12/2017 1710   APPEARANCEUR TURBID (A) 04/12/2017 1710   LABSPEC >1.030 (H) 04/12/2017 1710   PHURINE 6.0 04/12/2017 1710   GLUCOSEU NEGATIVE 04/12/2017 1710   HGBUR MODERATE (A) 04/12/2017 1710   BILIRUBINUR MODERATE (A) 04/12/2017 1710  KETONESUR 15 (A) 04/12/2017 1710   PROTEINUR 100 (A) 04/12/2017 1710   UROBILINOGEN 1.0 01/17/2014 1924   NITRITE NEGATIVE 04/12/2017 1710   LEUKOCYTESUR MODERATE (A) 04/12/2017 1710   Sepsis Labs: @LABRCNTIP (procalcitonin:4,lacticidven:4) ) Recent Results (from the past 240 hour(s))  Resp Panel by RT-PCR (Flu A&B, Covid) Nasopharyngeal Swab     Status: None   Collection Time: 07/16/20 10:15 PM   Specimen: Nasopharyngeal Swab; Nasopharyngeal(NP) swabs in vial transport medium  Result Value Ref Range Status   SARS Coronavirus 2 by RT PCR NEGATIVE NEGATIVE Final    Comment: (NOTE) SARS-CoV-2 target nucleic acids are NOT DETECTED.  The SARS-CoV-2 RNA is generally detectable in upper respiratory specimens during the acute phase of infection. The lowest concentration of SARS-CoV-2 viral copies this assay can detect is 138 copies/mL. A negative result does not preclude SARS-Cov-2 infection and should not be used as the sole basis for treatment or other patient management decisions. A negative result may occur with  improper specimen collection/handling, submission of specimen other than nasopharyngeal swab, presence of viral mutation(s) within the areas targeted by this assay, and inadequate number of viral copies(<138  copies/mL). A negative result must be combined with clinical observations, patient history, and epidemiological information. The expected result is Negative.  Fact Sheet for Patients:  EntrepreneurPulse.com.au  Fact Sheet for Healthcare Providers:  IncredibleEmployment.be  This test is no t yet approved or cleared by the [place] FDA and  has been authorized for detection and/or diagnosis of SARS-CoV-2 by FDA under an Emergency Use Authorization (EUA). This EUA will remain  in effect (meaning this test can be used) for the duration of the COVID-19 declaration under Section 564(b)(1) of the Act, 21 U.S.C.section 360bbb-3(b)(1), unless the authorization is terminated  or revoked sooner.       Influenza A by PCR NEGATIVE NEGATIVE Final   Influenza B by PCR NEGATIVE NEGATIVE Final    Comment: (NOTE) The Xpert Xpress SARS-CoV-2/FLU/RSV plus assay is intended as an aid in the diagnosis of influenza from Nasopharyngeal swab specimens and should not be used as a sole basis for treatment. Nasal washings and aspirates are unacceptable for Xpert Xpress SARS-CoV-2/FLU/RSV testing.  Fact Sheet for Patients: EntrepreneurPulse.com.au  Fact Sheet for Healthcare Providers: IncredibleEmployment.be  This test is not yet approved or cleared by the [place] FDA and has been authorized for detection and/or diagnosis of SARS-CoV-2 by FDA under an Emergency Use Authorization (EUA). This EUA will remain in effect (meaning this test can be used) for the duration of the COVID-19 declaration under Section 564(b)(1) of the Act, 21 U.S.C. section 360bbb-3(b)(1), unless the authorization is terminated or revoked.  Performed at [facility], [address], [place], [place] [address]      Radiological Exams on Admission: No results found.  EKG: Independently reviewed.  Sinus  tachycardia.  Assessment/Plan Principal Problem:   Symptomatic anemia Active Problems:   Dementia (HCC)   Anemia    1. Severe anemia microcytic hypochromic anemia panel pending 2 units of PRBC transfusion ordered after getting consent from patient's niece Ms. Lewis.  Follow CBC after transfusion follow anemia panel. 2. History of dementia.  All home medications are to be reconciled and has to be ordered accordingly.   DVT prophylaxis: SCDs.  Avoiding anticoagulation due to severe anemia. Code Status: DNR confirmed with patient's niece. Family Communication: Patient's niece. Disposition Plan: Back to facility when stable. Consults called: None. Admission status: Observation.   Rise Patience MD [facility] Pager  [place] [phone].  If 7PM-7AM, please contact night-coverage [email or web address] Password [email or web address]  07/17/2020, 5:14 AM

## 2020-07-17 NOTE — Progress Notes (Signed)
PROGRESS NOTE  Audrey Norris MGQ:[id number] DOB: [birth year] DOA: 07/16/2020 PCP: Audrey Palau, MD (Inactive)  HPI/Recap of past 24 hours: HPI from Dr [name] is a 80 y.o. female with history of advanced dementia who was brought from [facility] to the ER at [facility] for progressive weakness. As per the patient's niece patient had some diarrhea and abdominal discomfort about 3 weeks ago and was taken to the ER and work-up at that time was showing likely viral gastroenteritis as per the niece. In the ER patient's hemoglobin was found to be 4.8, microcytic hypochromic picture.  High sensitive troponin was 4 and stool for occult blood was negative.  Given the severe anemia patient admitted for blood transfusion. COVID test was negative.    Today, patient noted to be agitated, restless.  Known history of dementia.  Met patient eating this morning, unable to perform extensive ROS, reports feeling cold.  Denies any pain.   Assessment/Plan: Principal Problem:   Symptomatic anemia Active Problems:   Dementia (HCC)   Anemia   Severe symptomatic anemia Unknown etiology, FOBT negative, ?GI source Hemoglobin 4.8 on presentation, previous 14.6 but back in 2019 Anemia panel pending CT abdomen/pelvis pending, to rule out any obvious source, ?mass Transfuse 2 PRBC Daily CBC  Dementia Noted delirium UA/UC pending Continue Depakote, risperidone, Zoloft, Ativan as needed Restraints as needed      Estimated body mass index is 18.99 kg/m as calculated from the following:   Height as of this encounter: '5\' 7"'  (1.702 m).   Weight as of this encounter: 55 kg.     Code Status: DNR  Family Communication: None at bedside  Disposition Plan: Status is: Inpatient  The patient will require care spanning > 2 midnights and should be moved to inpatient because: Inpatient level of care appropriate due to severity of illness  Dispo: The patient is  from: SNF              Anticipated d/c is to: SNF              Patient currently is not medically stable to d/c.   Difficult to place patient No   Consultants:  None  Procedures:  None  Antimicrobials:  None  DVT prophylaxis: SCDs, due to severe anemia   Objective: Vitals:   07/17/20 0433 07/17/20 0946 07/17/20 1134 07/17/20 1159  BP: (!) 136/56 (!) 148/98 (!) 136/46 (!) 148/56  Pulse: 67 87 88 92  Resp: '20 20 17 20  ' Temp:  98.8 F (37.1 C) 98.4 F (36.9 C) 98.1 F (36.7 C)  TempSrc:  Oral Oral Axillary  SpO2: 93% 100% 96% 97%  Weight:      Height:        Intake/Output Summary (Last 24 hours) at 07/17/2020 1408 Last data filed at 07/17/2020 1049 Gross per 24 hour  Intake 480 ml  Output --  Net 480 ml   Filed Weights   07/16/20 2200  Weight: 55 kg    Exam:  General: NAD, unable to engage in meaningful conversation  Cardiovascular: S1, S2 present  Respiratory: CTAB  Abdomen: Soft, nontender, nondistended, bowel sounds present  Musculoskeletal: No bilateral pedal edema noted  Skin: Normal  Psychiatry:  Unable to assess    Data Reviewed: CBC: Recent Labs  Lab 07/16/20 2215  WBC 11.5*  NEUTROABS 8.1*  HGB 4.8*  HCT 19.9*  MCV 56.7*  PLT 267*   Basic Metabolic Panel: Recent Labs  Lab 07/16/20 2215  NA 142  K 4.3  CL 109  CO2 25  GLUCOSE 99  BUN 13  CREATININE 0.79  CALCIUM 9.4   GFR: Estimated Creatinine Clearance: 49.5 mL/min (by C-G formula based on SCr of 0.79 mg/dL). Liver Function Tests: Recent Labs  Lab 07/16/20 2215  AST 12*  ALT 7  ALKPHOS 67  BILITOT 0.3  PROT 6.6  ALBUMIN 3.5   No results for input(s): LIPASE, AMYLASE in the last 168 hours. No results for input(s): AMMONIA in the last 168 hours. Coagulation Profile: No results for input(s): INR, PROTIME in the last 168 hours. Cardiac Enzymes: No results for input(s): CKTOTAL, CKMB, CKMBINDEX, TROPONINI in the last 168 hours. BNP (last 3 results) No  results for input(s): PROBNP in the last 8760 hours. HbA1C: No results for input(s): HGBA1C in the last 72 hours. CBG: No results for input(s): GLUCAP in the last 168 hours. Lipid Profile: No results for input(s): CHOL, HDL, LDLCALC, TRIG, CHOLHDL, LDLDIRECT in the last 72 hours. Thyroid Function Tests: No results for input(s): TSH, T4TOTAL, FREET4, T3FREE, THYROIDAB in the last 72 hours. Anemia Panel: Recent Labs    07/16/20 2320  RETICCTPCT 1.2   Urine analysis:    Component Value Date/Time   COLORURINE YELLOW 04/12/2017 1710   APPEARANCEUR TURBID (A) 04/12/2017 1710   LABSPEC >1.030 (H) 04/12/2017 1710   PHURINE 6.0 04/12/2017 1710   GLUCOSEU NEGATIVE 04/12/2017 1710   HGBUR MODERATE (A) 04/12/2017 1710   BILIRUBINUR MODERATE (A) 04/12/2017 1710   KETONESUR 15 (A) 04/12/2017 1710   PROTEINUR 100 (A) 04/12/2017 1710   UROBILINOGEN 1.0 01/17/2014 1924   NITRITE NEGATIVE 04/12/2017 1710   LEUKOCYTESUR MODERATE (A) 04/12/2017 1710   Sepsis Labs: '@LABRCNTIP' (procalcitonin:4,lacticidven:4)  ) Recent Results (from the past 240 hour(s))  Resp Panel by RT-PCR (Flu A&B, Covid) Nasopharyngeal Swab     Status: None   Collection Time: 07/16/20 10:15 PM   Specimen: Nasopharyngeal Swab; Nasopharyngeal(NP) swabs in vial transport medium  Result Value Ref Range Status   SARS Coronavirus 2 by RT PCR NEGATIVE NEGATIVE Final    Comment: (NOTE) SARS-CoV-2 target nucleic acids are NOT DETECTED.  The SARS-CoV-2 RNA is generally detectable in upper respiratory specimens during the acute phase of infection. The lowest concentration of SARS-CoV-2 viral copies this assay can detect is 138 copies/mL. A negative result does not preclude SARS-Cov-2 infection and should not be used as the sole basis for treatment or other patient management decisions. A negative result may occur with  improper specimen collection/handling, submission of specimen other than nasopharyngeal swab, presence of viral  mutation(s) within the areas targeted by this assay, and inadequate number of viral copies(<138 copies/mL). A negative result must be combined with clinical observations, patient history, and epidemiological information. The expected result is Negative.  Fact Sheet for Patients:  EntrepreneurPulse.com.au  Fact Sheet for Healthcare Providers:  IncredibleEmployment.be  This test is no t yet approved or cleared by the [place] FDA and  has been authorized for detection and/or diagnosis of SARS-CoV-2 by FDA under an Emergency Use Authorization (EUA). This EUA will remain  in effect (meaning this test can be used) for the duration of the COVID-19 declaration under Section 564(b)(1) of the Act, 21 U.S.C.section 360bbb-3(b)(1), unless the authorization is terminated  or revoked sooner.       Influenza A by PCR NEGATIVE NEGATIVE Final   Influenza B by PCR NEGATIVE NEGATIVE Final    Comment: (NOTE) The Xpert Xpress SARS-CoV-2/FLU/RSV plus  assay is intended as an aid in the diagnosis of influenza from Nasopharyngeal swab specimens and should not be used as a sole basis for treatment. Nasal washings and aspirates are unacceptable for Xpert Xpress SARS-CoV-2/FLU/RSV testing.  Fact Sheet for Patients: EntrepreneurPulse.com.au  Fact Sheet for Healthcare Providers: IncredibleEmployment.be  This test is not yet approved or cleared by the [place] FDA and has been authorized for detection and/or diagnosis of SARS-CoV-2 by FDA under an Emergency Use Authorization (EUA). This EUA will remain in effect (meaning this test can be used) for the duration of the COVID-19 declaration under Section 564(b)(1) of the Act, 21 U.S.C. section 360bbb-3(b)(1), unless the authorization is terminated or revoked.  Performed at [facility], [address], [place], [place] [address]       Studies: No  results found.  Scheduled Meds: . divalproex  125 mg Oral BID  . risperiDONE  0.5 mg Oral BID  . sertraline  50 mg Oral Daily  . sucralfate  1 g Oral TID    Continuous Infusions:   LOS: 0 days     Alma Friendly, MD [facility]  If 7PM-7AM, please contact night-coverage [email or web address] 07/17/2020, 2:08 PM

## 2020-07-18 ENCOUNTER — Inpatient Hospital Stay (HOSPITAL_COMMUNITY): Payer: Medicare Other

## 2020-07-18 DIAGNOSIS — G301 Alzheimer's disease with late onset: Secondary | ICD-10-CM

## 2020-07-18 DIAGNOSIS — D649 Anemia, unspecified: Secondary | ICD-10-CM | POA: Diagnosis not present

## 2020-07-18 DIAGNOSIS — F0281 Dementia in other diseases classified elsewhere with behavioral disturbance: Secondary | ICD-10-CM

## 2020-07-18 LAB — TYPE AND SCREEN
ABO/RH(D): O POS
Antibody Screen: NEGATIVE
Unit division: 0
Unit division: 0

## 2020-07-18 LAB — BPAM RBC
Blood Product Expiration Date: 202205252359
Blood Product Expiration Date: 202205252359
ISSUE DATE / TIME: 202204301140
ISSUE DATE / TIME: 202204301538
Unit Type and Rh: 5100
Unit Type and Rh: 5100

## 2020-07-18 LAB — BASIC METABOLIC PANEL WITH GFR
Anion gap: 4 — ABNORMAL LOW (ref 5–15)
BUN: 13 mg/dL (ref 8–23)
CO2: 28 mmol/L (ref 22–32)
Calcium: 9.5 mg/dL (ref 8.9–10.3)
Chloride: 110 mmol/L (ref 98–111)
Creatinine, Ser: 0.77 mg/dL (ref 0.44–1.00)
GFR, Estimated: >60 mL/min (ref >60)
Glucose, Bld: 91 mg/dL (ref 70–99)
Potassium: 4.2 mmol/L (ref 3.5–5.1)
Sodium: 142 mmol/L (ref 135–145)

## 2020-07-18 LAB — URINALYSIS, ROUTINE W REFLEX MICROSCOPIC
Bacteria, UA: NONE SEEN
Bilirubin Urine: NEGATIVE
Glucose, UA: NEGATIVE mg/dL
Ketones, ur: NEGATIVE mg/dL
Leukocytes,Ua: NEGATIVE
Nitrite: NEGATIVE
Protein, ur: NEGATIVE mg/dL
Specific Gravity, Urine: >1.046 — ABNORMAL HIGH (ref 1.005–1.030)
pH: 8 (ref 5.0–8.0)

## 2020-07-18 LAB — CBC WITH DIFFERENTIAL/PLATELET
Abs Immature Granulocytes: 0.1 10*3/uL — ABNORMAL HIGH (ref 0.00–0.07)
Basophils Absolute: 0.1 10*3/uL (ref 0.0–0.1)
Basophils Relative: 1 %
Eosinophils Absolute: 0.2 10*3/uL (ref 0.0–0.5)
Eosinophils Relative: 2 %
HCT: 30.2 % — ABNORMAL LOW (ref 36.0–46.0)
Hemoglobin: 8 g/dL — ABNORMAL LOW (ref 12.0–15.0)
Immature Granulocytes: 1 %
Lymphocytes Relative: 14 %
Lymphs Abs: 1.6 10*3/uL (ref 0.7–4.0)
MCH: 17.2 pg — ABNORMAL LOW (ref 26.0–34.0)
MCHC: 26.5 g/dL — ABNORMAL LOW (ref 30.0–36.0)
MCV: 64.8 fL — ABNORMAL LOW (ref 80.0–100.0)
Monocytes Absolute: 1.7 10*3/uL — ABNORMAL HIGH (ref 0.1–1.0)
Monocytes Relative: 15 %
Neutro Abs: 7.3 10*3/uL (ref 1.7–7.7)
Neutrophils Relative %: 67 %
Platelets: 625 10*3/uL — ABNORMAL HIGH (ref 150–400)
RBC: 4.66 MIL/uL (ref 3.87–5.11)
RDW: 29.1 % — ABNORMAL HIGH (ref 11.5–15.5)
WBC: 10.9 10*3/uL — ABNORMAL HIGH (ref 4.0–10.5)
nRBC: 0.8 % — ABNORMAL HIGH (ref 0.0–0.2)

## 2020-07-18 MED ORDER — LIP MEDEX EX OINT: TOPICAL_OINTMENT | CUTANEOUS | Status: DC | PRN

## 2020-07-18 MED ORDER — IOHEXOL 300 MG/ML  SOLN
75.0000 mL | Freq: Once | INTRAMUSCULAR | Status: AC | PRN
  Administered 2020-07-18: 75 mL via INTRAVENOUS

## 2020-07-18 MED ORDER — HYDRALAZINE HCL 20 MG/ML IJ SOLN
10.0000 mg | Freq: Three times a day (TID) | INTRAMUSCULAR | Status: DC | PRN
  Administered 2020-07-19: 10 mg via INTRAVENOUS
  Filled 2020-07-18: qty 1

## 2020-07-18 MED ORDER — FERROUS SULFATE 325 (65 FE) MG PO TABS
325.0000 mg | ORAL_TABLET | Freq: Every day | ORAL | Status: DC
  Administered 2020-07-19 – 2020-07-21 (×2): 325 mg via ORAL
  Filled 2020-07-18 (×3): qty 1

## 2020-07-18 NOTE — Progress Notes (Addendum)
PCCM:  I was called by Dr. Horris Latino to review images and case.   I have called and spoke with patients niece.  Patient has dementia at baseline therefor family will need to give consent for bronchoscopy.  I have reviewed the images.   ENB+EBUS would be recommended for biopsy to ensure tissue diagnosis and staging   Full Consult note to follow.   I have reviewed scheduling for available slots and spoke with my partner Dr. Lamonte Sakai.   This will need to be added on to [facility] End[facility] for Tuesday 07/20/2020  Garner Nash, DO [facility] 07/18/2020 5:41 PM

## 2020-07-18 NOTE — Plan of Care (Signed)
  Problem: Safety: Goal: Non-violent Restraint(s) Outcome: Progressing   Problem: Coping: Goal: Level of anxiety will decrease Outcome: Progressing   Problem: Safety: Goal: Ability to remain free from injury will improve Outcome: Progressing

## 2020-07-18 NOTE — Progress Notes (Signed)
PROGRESS NOTE  Audrey Norris LOV:[id number] DOB: Jul 13, 1940 DOA: 07/16/2020 PCP: [name], MD (Inactive)  HPI/Recap of past 24 hours: HPI from Dr [name] is a 80 y.o. female with history of advanced dementia who was brought from [facility] to the ER at [facility] for progressive weakness. As per the patient's niece patient had some diarrhea and abdominal discomfort about 3 weeks ago and was taken to the ER and work-up at that time was showing likely viral gastroenteritis as per the niece. In the ER patient's hemoglobin was found to be 4.8, microcytic hypochromic picture.  High sensitive troponin was 4 and stool for occult blood was negative.  Given the severe anemia patient admitted for blood transfusion. COVID test was negative.    Today, met patient resting comfortably in bed.  Discussed extensively with patient's niece Ms. Bowens about the new diagnosis of possible lung cancer, wants to proceed with biopsy and would most likely not pursue treatments but would like to know diagnosis.   Assessment/Plan: Principal Problem:   Symptomatic anemia Active Problems:   Dementia (HCC)   Anemia   Severe symptomatic anemia Likely 2/2 advanced lung Ca Hemoglobin 4.8 on presentation, previous 14.6 but back in 2019 Anemia panel showed iron 13, saturation 3, ferritin 4, folate 8.4, vitamin B12->319 Transfused 2 PRBC on 07/17/20 Start oral iron supplementation Daily CBC  Likely advanced lung cancer CT abdomen/pelvis, did not show any acute intra-abdominal pathology, but does show right lower lobe mass highly suspicious for malignancy CT chest showed right lower lobe primary malignancy extending towards the right helium, with adenopathy concerning for metastatic disease Discussed with Dr. Valeta Harms on 07/18/20, plan to biopsy on 07/20/20 at [facility]  Dementia Noted delirium UA/UC pending Continue Depakote, risperidone, Zoloft, Ativan as  needed Restraints as needed      Estimated body mass index is 18.99 kg/m as calculated from the following:   Height as of this encounter: _0  (1.702 m).   Weight as of this encounter: 55 kg.     Code Status: DNR  Family Communication: Discussed extensively with niece [name]  Disposition Plan: Status is: Inpatient  The patient will require care spanning > 2 midnights and should be moved to inpatient because: Inpatient level of care appropriate due to severity of illness  Dispo: The patient is from: SNF              Anticipated d/c is to: SNF              Patient currently is not medically stable to d/c.   Difficult to place patient No   Consultants:  Discussed with pulmonary  Procedures:  None  Antimicrobials:  None  DVT prophylaxis: SCDs, due to severe anemia   Objective: Vitals:   07/17/20 1602 07/17/20 1749 07/17/20 2054 07/18/20 1309  BP: (!) 151/50 (!) 170/82 (!) 166/99 (!) 166/89  Pulse: 87 76 98 85  Resp: _1 Temp: 98 F (36.7 C) 97.8 F (36.6 C) 98.5 F (36.9 C) (!) 97.5 F (36.4 C)  TempSrc: Oral Axillary Oral Oral  SpO2: 98% 98% 98% 100%  Weight:      Height:        Intake/Output Summary (Last 24 hours) at 07/18/2020 1522 Last data filed at 07/18/2020 1300 Gross per 24 hour  Intake 544 ml  Output 100 ml  Net 444 ml   Filed Weights   07/16/20 2200  Weight:  55 kg    Exam:  General: NAD, unable to engage in meaningful conversation  Cardiovascular: S1, S2 present  Respiratory: CTAB  Abdomen: Soft, nontender, nondistended, bowel sounds present  Musculoskeletal: No bilateral pedal edema noted  Skin: Normal  Psychiatry:  Unable to assess    Data Reviewed: CBC: Recent Labs  Lab 07/16/20 2215 07/18/20 0428  WBC 11.5* 10.9*  NEUTROABS 8.1* 7.3  HGB 4.8* 8.0*  HCT 19.9* 30.2*  MCV 56.7* 64.8*  PLT 698* 235*   Basic Metabolic Panel: Recent Labs  Lab 07/16/20 2215 07/18/20 0428  NA 142 142  K 4.3 4.2  CL  109 110  CO2 25 28  GLUCOSE 99 91  BUN 13 13  CREATININE 0.79 0.77  CALCIUM 9.4 9.5   GFR: Estimated Creatinine Clearance: 49.5 mL/min (by C-G formula based on SCr of 0.77 mg/dL). Liver Function Tests: Recent Labs  Lab 07/16/20 2215  AST 12*  ALT 7  ALKPHOS 67  BILITOT 0.3  PROT 6.6  ALBUMIN 3.5   No results for input(s): LIPASE, AMYLASE in the last 168 hours. No results for input(s): AMMONIA in the last 168 hours. Coagulation Profile: No results for input(s): INR, PROTIME in the last 168 hours. Cardiac Enzymes: No results for input(s): CKTOTAL, CKMB, CKMBINDEX, TROPONINI in the last 168 hours. BNP (last 3 results) No results for input(s): PROBNP in the last 8760 hours. HbA1C: No results for input(s): HGBA1C in the last 72 hours. CBG: No results for input(s): GLUCAP in the last 168 hours. Lipid Profile: No results for input(s): CHOL, HDL, LDLCALC, TRIG, CHOLHDL, LDLDIRECT in the last 72 hours. Thyroid Function Tests: No results for input(s): TSH, T4TOTAL, FREET4, T3FREE, THYROIDAB in the last 72 hours. Anemia Panel: Recent Labs    07/16/20 2320  VITAMINB12 319  FOLATE 8.4  FERRITIN 4*  TIBC 456*  IRON 13*  RETICCTPCT 1.2   Urine analysis:    Component Value Date/Time   COLORURINE STRAW (A) 07/18/2020 1312   APPEARANCEUR CLEAR 07/18/2020 1312   LABSPEC >1.046 (H) 07/18/2020 1312   PHURINE 8.0 07/18/2020 1312   GLUCOSEU NEGATIVE 07/18/2020 1312   HGBUR SMALL (A) 07/18/2020 1312   BILIRUBINUR NEGATIVE 07/18/2020 1312   KETONESUR NEGATIVE 07/18/2020 1312   PROTEINUR NEGATIVE 07/18/2020 1312   UROBILINOGEN 1.0 01/17/2014 1924   NITRITE NEGATIVE 07/18/2020 1312   LEUKOCYTESUR NEGATIVE 07/18/2020 1312   Sepsis Labs: _0 (procalcitonin:4,lacticidven:4)  ) Recent Results (from the past 240 hour(s))  Resp Panel by RT-PCR (Flu A&B, Covid) Nasopharyngeal Swab     Status: None   Collection Time: 07/16/20 10:15 PM   Specimen: Nasopharyngeal Swab;  Nasopharyngeal(NP) swabs in vial transport medium  Result Value Ref Range Status   SARS Coronavirus 2 by RT PCR NEGATIVE NEGATIVE Final    Comment: (NOTE) SARS-CoV-2 target nucleic acids are NOT DETECTED.  The SARS-CoV-2 RNA is generally detectable in upper respiratory specimens during the acute phase of infection. The lowest concentration of SARS-CoV-2 viral copies this assay can detect is 138 copies/mL. A negative result does not preclude SARS-Cov-2 infection and should not be used as the sole basis for treatment or other patient management decisions. A negative result may occur with  improper specimen collection/handling, submission of specimen other than nasopharyngeal swab, presence of viral mutation(s) within the areas targeted by this assay, and inadequate number of viral copies(<138 copies/mL). A negative result must be combined with clinical observations, patient history, and epidemiological information. The expected result is Negative.  Fact Sheet for Patients:  EntrepreneurPulse.com.au  Fact Sheet for Healthcare Providers:  IncredibleEmployment.be  This test is no t yet approved or cleared by the [place] FDA and  has been authorized for detection and/or diagnosis of SARS-CoV-2 by FDA under an Emergency Use Authorization (EUA). This EUA will remain  in effect (meaning this test can be used) for the duration of the COVID-19 declaration under Section 564(b)(1) of the Act, 21 U.S.C.section 360bbb-3(b)(1), unless the authorization is terminated  or revoked sooner.       Influenza A by PCR NEGATIVE NEGATIVE Final   Influenza B by PCR NEGATIVE NEGATIVE Final    Comment: (NOTE) The Xpert Xpress SARS-CoV-2/FLU/RSV plus assay is intended as an aid in the diagnosis of influenza from Nasopharyngeal swab specimens and should not be used as a sole basis for treatment. Nasal washings and aspirates are unacceptable for Xpert Xpress  SARS-CoV-2/FLU/RSV testing.  Fact Sheet for Patients: EntrepreneurPulse.com.au  Fact Sheet for Healthcare Providers: IncredibleEmployment.be  This test is not yet approved or cleared by the [place] FDA and has been authorized for detection and/or diagnosis of SARS-CoV-2 by FDA under an Emergency Use Authorization (EUA). This EUA will remain in effect (meaning this test can be used) for the duration of the COVID-19 declaration under Section 564(b)(1) of the Act, 21 U.S.C. section 360bbb-3(b)(1), unless the authorization is terminated or revoked.  Performed at [facility], [address], [place], [place] [address]       Studies: CT CHEST W CONTRAST  Result Date: 07/18/2020 CLINICAL DATA:  Right lower lobe consolidation versus mass with enlarged right hilar lymph nodes. EXAM: CT CHEST WITH CONTRAST TECHNIQUE: Multidetector CT imaging of the chest was performed during intravenous contrast administration. CONTRAST:  18m OMNIPAQUE IOHEXOL 300 MG/ML  SOLN COMPARISON:  CT the abdomen and pelvis July 17, 2020. FINDINGS: Cardiovascular: Three-vessel calcified coronary artery disease. Mild cardiomegaly. Calcified atherosclerosis in the nonaneurysmal thoracic aorta. Central pulmonary arteries are normal. Mediastinum/Nodes: Several small nodules are seen in the thyroid lobes bilaterally. The largest measures 11 mm. The esophagus is normal. Tiny pleural effusions. No pericardial effusion. There is an enlarged lymph node in the subcarinal region, adjacent to the right mainstem bronchus measuring 1.4 cm on series 2, image 79. There is adenopathy in the right hilar and infrahilar region as well. A lymph node on series 2, image 99 measures 12 mm. No other adenopathy identified. The chest wall is normal. Lungs/Pleura: The trachea and mainstem bronchi are normal. There is occlusion of a right lower lobe bronchus with abrupt cut off best seen on  coronal image 75. There is a masslike opacity in the right base measuring 7.6 x 5.5 x 5.8 cm in transverse, AP, and craniocaudal dimensions. The mass extends towards the hilum with apparent in case mint of a right lower lobe bronchus seen on coronal image 76 and axial image 106. There is a tiny 3 or 4 mm nodule in the periphery of the right lung on series 5, image 80. Another nodule is seen in the lingula on series 5, image 100 measuring 7 mm. No other nodules or masses. Emphysematous changes are seen. No evidence of pneumonia. Upper Abdomen: No acute abnormality. Musculoskeletal: Anterior wedging of L1 better appreciated on the recent CT scan, age indeterminate. No evidence of bony metastatic disease. IMPRESSION: 1. The findings are most consistent with a a right lower lobe primary malignancy extending towards the right hilum with encasement of a right lower lobe bronchus. The adenopathy in the right infrahilar, hilar,  and subcarinal regions is concerning for metastatic disease. 2. There is a 3 or 4 mm nodule in the periphery of the right lung on series 5, image 80. A 7 mm nodule seen in the lingula, possibly focal atelectasis but nonspecific. Recommend attention on follow-up. 3. Three-vessel calcified coronary artery disease. 4. Mild cardiomegaly. 5. Calcified atherosclerosis in the nonaneurysmal aorta. 6. Thyroid nodules as above. The largest measures 11 mm. Not clinically significant; no follow-up imaging recommended (ref: J Am Coll Radiol. 2015 Feb;12(2): 143-50). 7. Emphysema. 8. Age-indeterminate anterior wedging of L1 also described on the CT of the abdomen and pelvis from yesterday. Aortic Atherosclerosis (ICD10-I70.0) and Emphysema (ICD10-J43.9). Electronically Signed   By: Dorise Bullion III M.D   On: 07/18/2020 12:53   CT ABDOMEN PELVIS W CONTRAST  Result Date: 07/17/2020 CLINICAL DATA:  80 year old female with diarrhea, increasing weakness and dementia. EXAM: CT ABDOMEN AND PELVIS WITH CONTRAST  TECHNIQUE: Multidetector CT imaging of the abdomen and pelvis was performed using the standard protocol following bolus administration of intravenous contrast. CONTRAST:  69m OMNIPAQUE IOHEXOL 300 MG/ML  SOLN COMPARISON:  01/17/2014 CT FINDINGS: Lower chest: A new 7 x 7 x 4.5 cm posterior RIGHT LOWER lobe consolidation/mass is identified with enlarged RIGHT hilar lymph nodes, highly suspicious for malignancy. No pleural effusions are identified. Hepatobiliary: Small hepatic cysts are again noted. No new hepatic abnormalities are identified. The gallbladder Cholelithiasis identified without CT evidence of acute cholecystitis. Pancreas: Unchanged pancreatic ductal dilatation identified without pancreatic mass, stable since 2015. Spleen: Unchanged.  No definite abnormality. Adrenals/Urinary Tract: A 2 cm calculus within the RIGHT renal pelvis to the UPJ is noted with mild to moderate RIGHT hydronephrosis. Smaller nonobstructing RIGHT renal calculi are noted. Bilateral renal cortical atrophy is present. The adrenal glands and bladder are unremarkable. Stomach/Bowel: Stomach is within normal limits. Appendix appears normal. No evidence of bowel wall thickening, distention, or inflammatory changes. Vascular/Lymphatic: Aortic atherosclerosis. No enlarged abdominal or pelvic lymph nodes. Reproductive: Status post hysterectomy. No adnexal masses. Other: No ascites, pneumoperitoneum or focal collection. Musculoskeletal: A 50% SUPERIOR endplate compression fracture of L1 is age indeterminate but new since 89 Moderate degenerative disc disease at L5-S1 again noted. IMPRESSION: 1. 7 x 7 x 4.5 cm RIGHT LOWER lobe consolidation/mass with enlarged RIGHT hilar lymph nodes, highly suspicious for malignancy. Infection or other etiologies considered less likely. 2. 2 cm RIGHT renal pelvis calculus to the UPJ with mild to moderate RIGHT hydronephrosis. 3. Age indeterminate 50% SUPERIOR endplate compression fracture of L1, new since  2015. 4. Cholelithiasis without CT evidence of acute cholecystitis. 5. Aortic Atherosclerosis (ICD10-I70.0). Electronically Signed   By: JMargarette CanadaM.D.   On: 07/17/2020 15:40    Scheduled Meds: . divalproex  125 mg Oral BID  . risperiDONE  0.5 mg Oral BID  . sertraline  50 mg Oral Daily  . sucralfate  1 g Oral TID    Continuous Infusions:   LOS: 1 day     NAlma Friendly MD [facility]  If 7PM-7AM, please contact night-coverage [email or web address] 07/18/2020, 3:22 PM

## 2020-07-19 DIAGNOSIS — F0281 Dementia in other diseases classified elsewhere with behavioral disturbance: Secondary | ICD-10-CM | POA: Diagnosis not present

## 2020-07-19 DIAGNOSIS — G301 Alzheimer's disease with late onset: Secondary | ICD-10-CM | POA: Diagnosis not present

## 2020-07-19 DIAGNOSIS — D649 Anemia, unspecified: Secondary | ICD-10-CM | POA: Diagnosis not present

## 2020-07-19 LAB — CBC WITH DIFFERENTIAL/PLATELET
Abs Immature Granulocytes: 0.11 10*3/uL — ABNORMAL HIGH (ref 0.00–0.07)
Basophils Absolute: 0.1 10*3/uL (ref 0.0–0.1)
Basophils Relative: 1 %
Eosinophils Absolute: 0.4 10*3/uL (ref 0.0–0.5)
Eosinophils Relative: 3 %
HCT: 35.3 % — ABNORMAL LOW (ref 36.0–46.0)
Hemoglobin: 9.4 g/dL — ABNORMAL LOW (ref 12.0–15.0)
Immature Granulocytes: 1 %
Lymphocytes Relative: 18 %
Lymphs Abs: 2.4 10*3/uL (ref 0.7–4.0)
MCH: 17.1 pg — ABNORMAL LOW (ref 26.0–34.0)
MCHC: 26.6 g/dL — ABNORMAL LOW (ref 30.0–36.0)
MCV: 64.1 fL — ABNORMAL LOW (ref 80.0–100.0)
Monocytes Absolute: 1.9 10*3/uL — ABNORMAL HIGH (ref 0.1–1.0)
Monocytes Relative: 14 %
Neutro Abs: 8 10*3/uL — ABNORMAL HIGH (ref 1.7–7.7)
Neutrophils Relative %: 63 %
Platelets: 611 10*3/uL — ABNORMAL HIGH (ref 150–400)
RBC: 5.51 MIL/uL — ABNORMAL HIGH (ref 3.87–5.11)
RDW: 30.1 % — ABNORMAL HIGH (ref 11.5–15.5)
WBC: 12.8 10*3/uL — ABNORMAL HIGH (ref 4.0–10.5)
nRBC: 0.3 % — ABNORMAL HIGH (ref 0.0–0.2)

## 2020-07-19 LAB — URINE CULTURE: Culture: <10000 — AB

## 2020-07-19 LAB — BASIC METABOLIC PANEL
Anion gap: 8 (ref 5–15)
BUN: 10 mg/dL (ref 8–23)
CO2: 25 mmol/L (ref 22–32)
Calcium: 9.7 mg/dL (ref 8.9–10.3)
Chloride: 105 mmol/L (ref 98–111)
Creatinine, Ser: 0.68 mg/dL (ref 0.44–1.00)
GFR, Estimated: >60 mL/min (ref >60)
Glucose, Bld: 106 mg/dL — ABNORMAL HIGH (ref 70–99)
Potassium: 4.1 mmol/L (ref 3.5–5.1)
Sodium: 138 mmol/L (ref 135–145)

## 2020-07-19 MED ORDER — DEXTROSE 50 % IV SOLN
INTRAVENOUS | Status: AC
  Filled 2020-07-19: qty 100

## 2020-07-19 MED ORDER — SODIUM CHLORIDE 0.9 % IV SOLN: INTRAVENOUS | Status: DC

## 2020-07-19 MED ORDER — SENNOSIDES-DOCUSATE SODIUM 8.6-50 MG PO TABS
1.0000 | ORAL_TABLET | Freq: Every day | ORAL | Status: DC
  Administered 2020-07-19 – 2020-07-20 (×2): 1 via ORAL
  Filled 2020-07-19 (×4): qty 1

## 2020-07-19 MED ORDER — SENNOSIDES-DOCUSATE SODIUM 8.6-50 MG PO TABS: 1.0000 | ORAL_TABLET | Freq: Two times a day (BID) | ORAL | Status: DC

## 2020-07-19 NOTE — Progress Notes (Signed)
Pt PIV leaking upon flush. Attempted to insert PIV. Pt allowed me to looked at arm initially, but then refused multiple times after that. Pt also refused temperature to be taken during routine vitals. MD notified.

## 2020-07-19 NOTE — Progress Notes (Signed)
Pt update given to niece, Nadara Mustard. Questions and concerns adressed.

## 2020-07-19 NOTE — Plan of Care (Signed)
Pt still has much anxiety when trying to position external catheter and during hands-on care. Relaxing music utilized to reduce anxiety. Anxiety decreased,but still present intermittently. Now that PIV is in place,will give IV ativan if anxiety increases or continues.  Problem: Safety: Goal: Non-violent Restraint(s) Outcome: Progressing   Problem: Education: Goal: Knowledge of General Education information will improve Description: Including pain rating scale, medication(s)/side effects and non-pharmacologic comfort measures Outcome: Progressing   Problem: Health Behavior/Discharge Planning: Goal: Ability to manage health-related needs will improve Outcome: Progressing   Problem: Clinical Measurements: Goal: Ability to maintain clinical measurements within normal limits will improve Outcome: Progressing Goal: Will remain free from infection Outcome: Progressing Goal: Diagnostic test results will improve Outcome: Progressing Goal: Respiratory complications will improve Outcome: Progressing Goal: Cardiovascular complication will be avoided Outcome: Progressing   Problem: Activity: Goal: Risk for activity intolerance will decrease Outcome: Progressing   Problem: Nutrition: Goal: Adequate nutrition will be maintained Outcome: Progressing   Problem: Coping: Goal: Level of anxiety will decrease Outcome: Progressing   Problem: Elimination: Goal: Will not experience complications related to bowel motility Outcome: Progressing Goal: Will not experience complications related to urinary retention Outcome: Progressing   Problem: Pain Managment: Goal: General experience of comfort will improve Outcome: Progressing   Problem: Safety: Goal: Ability to remain free from injury will improve Outcome: Progressing   Problem: Skin Integrity: Goal: Risk for impaired skin integrity will decrease Outcome: Progressing

## 2020-07-19 NOTE — Progress Notes (Signed)
PROGRESS NOTE  Audrey Norris INO:[id number] DOB: 16-Sep-1940 DOA: 07/16/2020 PCP: [name], MD (Inactive)  HPI/Recap of past 24 hours: HPI from Dr [name] is a 80 y.o. female with history of advanced dementia who was brought from [facility] to the ER at [facility] for progressive weakness. As per the patient's niece patient had some diarrhea and abdominal discomfort about 3 weeks ago and was taken to the ER and work-up at that time was showing likely viral gastroenteritis as per the niece. In the ER patient's hemoglobin was found to be 4.8, microcytic hypochromic picture.  High sensitive troponin was 4 and stool for occult blood was negative.  Given the severe anemia patient admitted for blood transfusion. COVID test was negative.    Today, patient was awake, alert, unable to have any meaningful conversation.  Lips appear dry, poor oral intake.   Assessment/Plan: Principal Problem:   Symptomatic anemia Active Problems:   Dementia (HCC)   Anemia   Severe symptomatic anemia Likely 2/2 advanced lung Ca Hemoglobin 4.8 on presentation, previous 14.6 but back in 2019 Anemia panel showed iron 13, saturation 3, ferritin 4, folate 8.4, vitamin B12->319 Transfused 2 PRBC on 07/17/20 Oral iron supplementation + bowel regimen Daily CBC  Likely advanced lung cancer CT abdomen/pelvis, did not show any acute intra-abdominal pathology, but does show right lower lobe mass highly suspicious for malignancy CT chest showed right lower lobe primary malignancy extending towards the right helium, with adenopathy concerning for metastatic disease Discussed with Dr. Valeta Harms on 07/18/20, plan to biopsy on 07/20/20 at [facility] Discussed with patient's niece [name] about diagnosis, wants to proceed with biopsy, unlikely to proceed with treatment, but just wants to know diagnosis  Leukocytosis Currently afebrile, possibly reactive  UC with  insignificant growth CT chest with no evidence of pneumonia Daily CBC  Dehydration Likely 2/2 poor oral inake Dry mucous membrane, UA with high SG Gentle IV hydration with 1L NS Encourage oral hydration as well  Dementia Noted intermittent delirium, agitation Continue Depakote, risperidone, Zoloft, Ativan as needed Restraints as needed      Estimated body mass index is 18.99 kg/m as calculated from the following:   Height as of this encounter: 5\' 7"  (1.702 m).   Weight as of this encounter: 55 kg.     Code Status: DNR  Family Communication: Discussed extensively with niece [name] on 07/18/20  Disposition Plan: Status is: Inpatient  The patient will require care spanning > 2 midnights and should be moved to inpatient because: Inpatient level of care appropriate due to severity of illness  Dispo: The patient is from: SNF              Anticipated d/c is to: SNF              Patient currently is not medically stable to d/c.   Difficult to place patient No   Consultants: Pulmonary  Procedures:  None  Antimicrobials:  None  DVT prophylaxis: SCDs, due to severe anemia, planning for bronch   Objective: Vitals:   07/17/20 2054 07/18/20 1309 07/18/20 2031 07/19/20 0445  BP: (!) 166/99 (!) 166/89 (!) 172/64 (!) 181/83  Pulse: 98 85 76 83  Resp: 18 16 18 18   Temp: 98.5 F (36.9 C) (!) 97.5 F (36.4 C) 97.8 F (36.6 C) 97.7 F (36.5 C)  TempSrc: Oral Oral Oral Oral  SpO2: 98% 100% 95% 98%  Weight:  Height:        Intake/Output Summary (Last 24 hours) at 07/19/2020 1301 Last data filed at 07/19/2020 1213 Gross per 24 hour  Intake 250 ml  Output 200 ml  Net 50 ml   Filed Weights   07/16/20 2200  Weight: 55 kg    Exam:  General: NAD, unable to engage in meaningful conversation, lips appear dry  Cardiovascular: S1, S2 present  Respiratory: CTAB  Abdomen: Soft, nontender, nondistended, bowel sounds present  Musculoskeletal: No bilateral pedal  edema noted  Skin: Normal  Psychiatry:  Unable to assess    Data Reviewed: CBC: Recent Labs  Lab 07/16/20 2215 07/18/20 0428 07/19/20 0414  WBC 11.5* 10.9* 12.8*  NEUTROABS 8.1* 7.3 8.0*  HGB 4.8* 8.0* 9.4*  HCT 19.9* 30.2* 35.3*  MCV 56.7* 64.8* 64.1*  PLT 698* 625* 536*   Basic Metabolic Panel: Recent Labs  Lab 07/16/20 2215 07/18/20 0428 07/19/20 0414  NA 142 142 138  K 4.3 4.2 4.1  CL 109 110 105  CO2 25 28 25   GLUCOSE 99 91 106*  BUN 13 13 10   CREATININE 0.79 0.77 0.68  CALCIUM 9.4 9.5 9.7   GFR: Estimated Creatinine Clearance: 49.5 mL/min (by C-G formula based on SCr of 0.68 mg/dL). Liver Function Tests: Recent Labs  Lab 07/16/20 2215  AST 12*  ALT 7  ALKPHOS 67  BILITOT 0.3  PROT 6.6  ALBUMIN 3.5   No results for input(s): LIPASE, AMYLASE in the last 168 hours. No results for input(s): AMMONIA in the last 168 hours. Coagulation Profile: No results for input(s): INR, PROTIME in the last 168 hours. Cardiac Enzymes: No results for input(s): CKTOTAL, CKMB, CKMBINDEX, TROPONINI in the last 168 hours. BNP (last 3 results) No results for input(s): PROBNP in the last 8760 hours. HbA1C: No results for input(s): HGBA1C in the last 72 hours. CBG: No results for input(s): GLUCAP in the last 168 hours. Lipid Profile: No results for input(s): CHOL, HDL, LDLCALC, TRIG, CHOLHDL, LDLDIRECT in the last 72 hours. Thyroid Function Tests: No results for input(s): TSH, T4TOTAL, FREET4, T3FREE, THYROIDAB in the last 72 hours. Anemia Panel: Recent Labs    07/16/20 2320  VITAMINB12 319  FOLATE 8.4  FERRITIN 4*  TIBC 456*  IRON 13*  RETICCTPCT 1.2   Urine analysis:    Component Value Date/Time   COLORURINE STRAW (A) 07/18/2020 1312   APPEARANCEUR CLEAR 07/18/2020 1312   LABSPEC >1.046 (H) 07/18/2020 1312   PHURINE 8.0 07/18/2020 1312   GLUCOSEU NEGATIVE 07/18/2020 1312   HGBUR SMALL (A) 07/18/2020 1312   BILIRUBINUR NEGATIVE 07/18/2020 1312    KETONESUR NEGATIVE 07/18/2020 1312   PROTEINUR NEGATIVE 07/18/2020 1312   UROBILINOGEN 1.0 01/17/2014 1924   NITRITE NEGATIVE 07/18/2020 1312   LEUKOCYTESUR NEGATIVE 07/18/2020 1312   Sepsis Labs: @LABRCNTIP (procalcitonin:4,lacticidven:4)  ) Recent Results (from the past 240 hour(s))  Resp Panel by RT-PCR (Flu A&B, Covid) Nasopharyngeal Swab     Status: None   Collection Time: 07/16/20 10:15 PM   Specimen: Nasopharyngeal Swab; Nasopharyngeal(NP) swabs in vial transport medium  Result Value Ref Range Status   SARS Coronavirus 2 by RT PCR NEGATIVE NEGATIVE Final    Comment: (NOTE) SARS-CoV-2 target nucleic acids are NOT DETECTED.  The SARS-CoV-2 RNA is generally detectable in upper respiratory specimens during the acute phase of infection. The lowest concentration of SARS-CoV-2 viral copies this assay can detect is 138 copies/mL. A negative result does not preclude SARS-Cov-2 infection and should not be used as  the sole basis for treatment or other patient management decisions. A negative result may occur with  improper specimen collection/handling, submission of specimen other than nasopharyngeal swab, presence of viral mutation(s) within the areas targeted by this assay, and inadequate number of viral copies(<138 copies/mL). A negative result must be combined with clinical observations, patient history, and epidemiological information. The expected result is Negative.  Fact Sheet for Patients:  EntrepreneurPulse.com.au  Fact Sheet for Healthcare Providers:  IncredibleEmployment.be  This test is no t yet approved or cleared by the [place] FDA and  has been authorized for detection and/or diagnosis of SARS-CoV-2 by FDA under an Emergency Use Authorization (EUA). This EUA will remain  in effect (meaning this test can be used) for the duration of the COVID-19 declaration under Section 564(b)(1) of the Act, 21 U.S.C.section  360bbb-3(b)(1), unless the authorization is terminated  or revoked sooner.       Influenza A by PCR NEGATIVE NEGATIVE Final   Influenza B by PCR NEGATIVE NEGATIVE Final    Comment: (NOTE) The Xpert Xpress SARS-CoV-2/FLU/RSV plus assay is intended as an aid in the diagnosis of influenza from Nasopharyngeal swab specimens and should not be used as a sole basis for treatment. Nasal washings and aspirates are unacceptable for Xpert Xpress SARS-CoV-2/FLU/RSV testing.  Fact Sheet for Patients: EntrepreneurPulse.com.au  Fact Sheet for Healthcare Providers: IncredibleEmployment.be  This test is not yet approved or cleared by the [place] FDA and has been authorized for detection and/or diagnosis of SARS-CoV-2 by FDA under an Emergency Use Authorization (EUA). This EUA will remain in effect (meaning this test can be used) for the duration of the COVID-19 declaration under Section 564(b)(1) of the Act, 21 U.S.C. section 360bbb-3(b)(1), unless the authorization is terminated or revoked.  Performed at [facility], [address] [place], [place] [address]   Urine culture     Status: Abnormal   Collection Time: 07/18/20  1:12 PM   Specimen: Urine, Clean Catch  Result Value Ref Range Status   Specimen Description   Final    URINE, CLEAN CATCH Performed at [facility], [place] [address] [place], [place] [address]    Special Requests   Final    NONE Performed at [facility], [place] [address] [place], [place] [address]    Culture (A)  Final    <10,000 COLONIES/mL INSIGNIFICANT GROWTH Performed at [facility] [address] [place], [place] [address]    Report Status 07/19/2020 FINAL  Final      Studies: No results found.  Scheduled Meds: . dextrose      . divalproex  125 mg Oral BID  . ferrous sulfate  325 mg Oral Q breakfast  . risperiDONE  0.5 mg Oral BID  .  senna-docusate  1 tablet Oral QHS  . sertraline  50 mg Oral Daily  . sucralfate  1 g Oral TID    Continuous Infusions: . sodium chloride       LOS: 2 days     Alma Friendly, MD [facility]  If 7PM-7AM, please contact night-coverage [email or web address] 07/19/2020, 1:01 PM

## 2020-07-20 ENCOUNTER — Encounter (HOSPITAL_COMMUNITY): Payer: Self-pay | Admitting: Internal Medicine

## 2020-07-20 ENCOUNTER — Other Ambulatory Visit: Payer: Self-pay

## 2020-07-20 ENCOUNTER — Encounter (HOSPITAL_COMMUNITY)
Admission: EM | Disposition: A | Discharge status: Skilled Nursing Facility | Payer: Self-pay | Source: Skilled Nursing Facility | Attending: Internal Medicine

## 2020-07-20 ENCOUNTER — Inpatient Hospital Stay (HOSPITAL_COMMUNITY): Payer: Medicare Other | Admitting: Certified Registered Nurse Anesthetist

## 2020-07-20 DIAGNOSIS — F0281 Dementia in other diseases classified elsewhere with behavioral disturbance: Secondary | ICD-10-CM | POA: Diagnosis not present

## 2020-07-20 DIAGNOSIS — G301 Alzheimer's disease with late onset: Secondary | ICD-10-CM | POA: Diagnosis not present

## 2020-07-20 DIAGNOSIS — R918 Other nonspecific abnormal finding of lung field: Secondary | ICD-10-CM | POA: Diagnosis not present

## 2020-07-20 DIAGNOSIS — R599 Enlarged lymph nodes, unspecified: Secondary | ICD-10-CM | POA: Diagnosis not present

## 2020-07-20 DIAGNOSIS — D649 Anemia, unspecified: Secondary | ICD-10-CM | POA: Diagnosis not present

## 2020-07-20 HISTORY — PX: BRONCHIAL BIOPSY: SHX5109

## 2020-07-20 HISTORY — PX: BRONCHIAL BRUSHINGS: SHX5108

## 2020-07-20 HISTORY — PX: FINE NEEDLE ASPIRATION: SHX5430

## 2020-07-20 HISTORY — PX: CRYOTHERAPY: SHX6894

## 2020-07-20 HISTORY — PX: VIDEO BRONCHOSCOPY WITH ENDOBRONCHIAL NAVIGATION: SHX6175

## 2020-07-20 HISTORY — PX: VIDEO BRONCHOSCOPY WITH ENDOBRONCHIAL ULTRASOUND: SHX6177

## 2020-07-20 LAB — BASIC METABOLIC PANEL
Anion gap: 8 (ref 5–15)
BUN: 14 mg/dL (ref 8–23)
CO2: 22 mmol/L (ref 22–32)
Calcium: 9.3 mg/dL (ref 8.9–10.3)
Chloride: 109 mmol/L (ref 98–111)
Creatinine, Ser: 0.7 mg/dL (ref 0.44–1.00)
GFR, Estimated: >60 mL/min (ref >60)
Glucose, Bld: 95 mg/dL (ref 70–99)
Potassium: 4.2 mmol/L (ref 3.5–5.1)
Sodium: 139 mmol/L (ref 135–145)

## 2020-07-20 LAB — CBC WITH DIFFERENTIAL/PLATELET
Abs Immature Granulocytes: 0.05 10*3/uL (ref 0.00–0.07)
Basophils Absolute: 0 10*3/uL (ref 0.0–0.1)
Basophils Relative: 0 %
Eosinophils Absolute: 0.2 10*3/uL (ref 0.0–0.5)
Eosinophils Relative: 2 %
HCT: 31.6 % — ABNORMAL LOW (ref 36.0–46.0)
Hemoglobin: 8.5 g/dL — ABNORMAL LOW (ref 12.0–15.0)
Immature Granulocytes: 1 %
Lymphocytes Relative: 15 %
Lymphs Abs: 1.6 10*3/uL (ref 0.7–4.0)
MCH: 17.1 pg — ABNORMAL LOW (ref 26.0–34.0)
MCHC: 26.9 g/dL — ABNORMAL LOW (ref 30.0–36.0)
MCV: 63.6 fL — ABNORMAL LOW (ref 80.0–100.0)
Monocytes Absolute: 1.4 10*3/uL — ABNORMAL HIGH (ref 0.1–1.0)
Monocytes Relative: 13 %
Neutro Abs: 7.8 10*3/uL — ABNORMAL HIGH (ref 1.7–7.7)
Neutrophils Relative %: 69 %
Platelets: 513 10*3/uL — ABNORMAL HIGH (ref 150–400)
RBC: 4.97 MIL/uL (ref 3.87–5.11)
RDW: 30.7 % — ABNORMAL HIGH (ref 11.5–15.5)
WBC: 11.1 10*3/uL — ABNORMAL HIGH (ref 4.0–10.5)
nRBC: 0 % (ref 0.0–0.2)

## 2020-07-20 LAB — GLUCOSE, CAPILLARY: Glucose-Capillary: 95 mg/dL (ref 70–99)

## 2020-07-20 SURGERY — VIDEO BRONCHOSCOPY WITH ENDOBRONCHIAL NAVIGATION
Anesthesia: General | Laterality: Right

## 2020-07-20 MED ORDER — ROCURONIUM BROMIDE 100 MG/10ML IV SOLN
INTRAVENOUS | Status: DC | PRN
  Administered 2020-07-20: 50 mg via INTRAVENOUS

## 2020-07-20 MED ORDER — LACTATED RINGERS IV SOLN: INTRAVENOUS | Status: DC

## 2020-07-20 MED ORDER — DEXAMETHASONE SODIUM PHOSPHATE 10 MG/ML IJ SOLN
INTRAMUSCULAR | Status: DC | PRN
  Administered 2020-07-20: 4 mg via INTRAVENOUS

## 2020-07-20 MED ORDER — CHLORHEXIDINE GLUCONATE 0.12 % MT SOLN: 15.0000 mL | Freq: Once | OROMUCOSAL | Status: AC

## 2020-07-20 MED ORDER — FENTANYL CITRATE (PF) 100 MCG/2ML IJ SOLN: 25.0000 ug | INTRAMUSCULAR | Status: DC | PRN

## 2020-07-20 MED ORDER — ONDANSETRON HCL 4 MG/2ML IJ SOLN
INTRAMUSCULAR | Status: DC | PRN
  Administered 2020-07-20: 4 mg via INTRAVENOUS

## 2020-07-20 MED ORDER — LIDOCAINE 2% (20 MG/ML) 5 ML SYRINGE
INTRAMUSCULAR | Status: DC | PRN
  Administered 2020-07-20: 20 mg via INTRAVENOUS

## 2020-07-20 MED ORDER — CHLORHEXIDINE GLUCONATE 0.12 % MT SOLN
OROMUCOSAL | Status: AC
  Administered 2020-07-20: 15 mL via OROMUCOSAL
  Filled 2020-07-20: qty 15

## 2020-07-20 MED ORDER — ONDANSETRON HCL 4 MG/2ML IJ SOLN: 4.0000 mg | Freq: Once | INTRAMUSCULAR | Status: DC | PRN

## 2020-07-20 MED ORDER — FENTANYL CITRATE (PF) 100 MCG/2ML IJ SOLN
INTRAMUSCULAR | Status: DC | PRN
  Administered 2020-07-20: 100 ug via INTRAVENOUS

## 2020-07-20 MED ORDER — SUGAMMADEX SODIUM 200 MG/2ML IV SOLN
INTRAVENOUS | Status: DC | PRN
  Administered 2020-07-20: 200 mg via INTRAVENOUS

## 2020-07-20 MED ORDER — PROPOFOL 10 MG/ML IV BOLUS
INTRAVENOUS | Status: DC | PRN
  Administered 2020-07-20: 90 mg via INTRAVENOUS

## 2020-07-20 SURGICAL SUPPLY — 53 items
ADAPTER BRONCH F/PENTAX (ADAPTER) ×4 IMPLANT
ADAPTER VALVE BIOPSY EBUS (MISCELLANEOUS) IMPLANT
ADPR BSCP EDG PNTX (ADAPTER) ×3
ADPTR VALVE BIOPSY EBUS (MISCELLANEOUS)
BRUSH CYTOL CELLEBRITY 1.5X140 (MISCELLANEOUS) ×4 IMPLANT
BRUSH SUPERTRAX BIOPSY (INSTRUMENTS) IMPLANT
BRUSH SUPERTRAX NDL-TIP CYTO (INSTRUMENTS) ×4 IMPLANT
CANISTER SUCT 3000ML PPV (MISCELLANEOUS) ×4 IMPLANT
CHANNEL WORK EXTEND EDGE 180 (KITS) IMPLANT
CHANNEL WORK EXTEND EDGE 45 (KITS) IMPLANT
CHANNEL WORK EXTEND EDGE 90 (KITS) IMPLANT
CONT SPEC 4OZ CLIKSEAL STRL BL (MISCELLANEOUS) ×4 IMPLANT
COVER BACK TABLE 60X90IN (DRAPES) ×4 IMPLANT
COVER DOME SNAP 22 D (MISCELLANEOUS) ×4 IMPLANT
FILTER STRAW FLUID ASPIR (MISCELLANEOUS) IMPLANT
FORCEPS BIOP RJ4 1.8 (CUTTING FORCEPS) IMPLANT
FORCEPS BIOP SUPERTRX PREMAR (INSTRUMENTS) ×4 IMPLANT
GAUZE SPONGE 4X4 12PLY STRL (GAUZE/BANDAGES/DRESSINGS) ×4 IMPLANT
GLOVE BIO SURGEON STRL SZ7.5 (GLOVE) ×4 IMPLANT
GLOVE SURG SS PI 7.5 STRL IVOR (GLOVE) ×8 IMPLANT
GOWN STRL REUS W/ TWL LRG LVL3 (GOWN DISPOSABLE) ×6 IMPLANT
GOWN STRL REUS W/TWL LRG LVL3 (GOWN DISPOSABLE) ×8
KIT CLEAN ENDO COMPLIANCE (KITS) ×8 IMPLANT
KIT LOCATABLE GUIDE (CANNULA) IMPLANT
KIT MARKER FIDUCIAL DELIVERY (KITS) IMPLANT
KIT PROCEDURE EDGE 180 (KITS) IMPLANT
KIT PROCEDURE EDGE 45 (KITS) IMPLANT
KIT PROCEDURE EDGE 90 (KITS) IMPLANT
KIT TURNOVER KIT B (KITS) ×4 IMPLANT
MARKER SKIN DUAL TIP RULER LAB (MISCELLANEOUS) ×4 IMPLANT
NDL EBUS SONO TIP PENTAX (NEEDLE) ×3 IMPLANT
NDL SUPERTRX PREMARK BIOPSY (NEEDLE) ×3 IMPLANT
NEEDLE EBUS SONO TIP PENTAX (NEEDLE) ×4 IMPLANT
NEEDLE SUPERTRX PREMARK BIOPSY (NEEDLE) ×4 IMPLANT
NS IRRIG 1000ML POUR BTL (IV SOLUTION) ×4 IMPLANT
OIL SILICONE PENTAX (PARTS (SERVICE/REPAIRS)) ×4 IMPLANT
PAD ARMBOARD 7.5X6 YLW CONV (MISCELLANEOUS) ×8 IMPLANT
PATCHES PATIENT (LABEL) ×12 IMPLANT
SOL ANTI FOG 6CC (MISCELLANEOUS) ×3 IMPLANT
SOLUTION ANTI FOG 6CC (MISCELLANEOUS) ×1
SYR 20CC LL (SYRINGE) ×8 IMPLANT
SYR 20ML ECCENTRIC (SYRINGE) ×8 IMPLANT
SYR 50ML SLIP (SYRINGE) ×4 IMPLANT
SYR 5ML LUER SLIP (SYRINGE) ×4 IMPLANT
TOWEL OR 17X24 6PK STRL BLUE (TOWEL DISPOSABLE) ×4 IMPLANT
TRAP SPECIMEN MUCOUS 40CC (MISCELLANEOUS) IMPLANT
TUBE CONNECTING 20X1/4 (TUBING) ×8 IMPLANT
UNDERPAD 30X30 (UNDERPADS AND DIAPERS) ×4 IMPLANT
VALVE BIOPSY  SINGLE USE (MISCELLANEOUS) ×4
VALVE BIOPSY SINGLE USE (MISCELLANEOUS) ×3 IMPLANT
VALVE DISPOSABLE (MISCELLANEOUS) ×4 IMPLANT
VALVE SUCTION BRONCHIO DISP (MISCELLANEOUS) ×4 IMPLANT
WATER STERILE IRR 1000ML POUR (IV SOLUTION) ×4 IMPLANT

## 2020-07-20 NOTE — Op Note (Signed)
Video bronchoscopy with endobronchial biopsy, cryobiopsy, tumor debulking and lesional excision Video Bronchoscopy with Endobronchial Ultrasound Procedure Note  Date of Operation: 07/20/2020  Pre-op Diagnosis: Right lower lobe lung mass, mediastinal adenopathy  Post-op Diagnosis: Right lower lobe lung mass, mediastinal adenopathy  Surgeon: Garner Nash, DO   Assistants: None   Anesthesia: General endotracheal anesthesia  Operation: Flexible video fiberoptic bronchoscopy with endobronchial ultrasound and biopsies.  Estimated Blood Loss: Minimal  Complications: None   Indications and History: Audrey Norris is a 80 y.o. female with right lower lobe lung mass, mediastinal adenopathy.  The risks, benefits, complications, treatment options and expected outcomes were discussed with the patient.  The possibilities of pneumothorax, pneumonia, reaction to medication, pulmonary aspiration, perforation of a viscus, bleeding, failure to diagnose a condition and creating a complication requiring transfusion or operation were discussed with the patient who freely signed the consent.    Description of Procedure: The patient was examined in the preoperative area and history and data from the preprocedure consultation were reviewed. It was deemed appropriate to proceed.  The patient was taken to [facility] endoscopy room 2, identified as Audrey Norris and the procedure verified as Flexible Video Fiberoptic Bronchoscopy.  A Time Out was held and the above information confirmed. After being taken to the operating room general anesthesia was initiated and the patient  was orally intubated. The video fiberoptic bronchoscope was introduced via the endotracheal tube and a general inspection was performed which showed normal right and left lung anatomy however the right lower lobe distal segment had visible necrotic appearing white tumor protruding.  We used a regular cytology brush to obtain specimens followed by  endobronchial forceps with [employer] 2.8 mm forceps to take endobronchial biopsies and samples.  Due to the fact that the tumor appeared pedunculated we switched to the erbe 1.7 mm cryotherapy probe.  Using the cryotherapy probe we completed endobronchial cryotherapy biopsies with en bloc retraction of tumor.  A large portion of the tumor broke off and we were able to remove it from the airway.  It measured approximately 2.5 cm.  Cryotherapy was then used for endobronchial lesional excision and tumor debulking. Saline was used for irrigation.  We used a trap to collect right lower lobe and right mainstem bronchial washings for cytology. the standard scope was then withdrawn and the endobronchial ultrasound was used to identify and characterize the peritracheal, hilar and bronchial lymph nodes. Inspection showed normal right and left lung anatomy, enlarged mediastinal hilar and subcarinal nodes. Using real-time ultrasound guidance Wang needle biopsies were take from Station 7 nodes and were sent for cytology. The patient tolerated the procedure well without apparent complications.  The standard therapeutic bronchoscope was reinserted to the airway and aspiration of the bilateral mainstem was necessary for removal of any remaining blood clots and debris.  All distal subsegments were patent at the termination of the procedure. there was no significant blood loss. The bronchoscope was withdrawn. Anesthesia was reversed and the patient was taken to the PACU for recovery.   Samples: 1.  Endobronchial brushings right lower lobe 2.  Endobronchial forcep biopsies right lower lobe 3.  Endobronchial cryo biopsies right lower lobe 4.  Right lower lobe and right mainstem bronchial washings for cytology  Plans:  The patient will be discharged from the PACU to home when recovered from anesthesia. We will review the cytology, pathology and microbiology results with the patient when they become available. Outpatient  followup will be with Octavio Graves  Audrey Votta, DO.   Garner Nash, DO [facility] 07/20/2020 4:53 PM

## 2020-07-20 NOTE — Interval H&P Note (Signed)
History and Physical Interval Note:  07/20/2020 2:59 PM  Audrey Norris  has presented today for surgery, with the diagnosis of lung mass, adenopathy.  The various methods of treatment have been discussed with the patient and family. After consideration of risks, benefits and other options for treatment, the patient has consented to  Procedure(s): VIDEO BRONCHOSCOPY WITH ENDOBRONCHIAL NAVIGATION (Right) VIDEO BRONCHOSCOPY WITH ENDOBRONCHIAL ULTRASOUND (Bilateral) as a surgical intervention.  The patient's history has been reviewed, patient examined, no change in status, stable for surgery.  I have reviewed the patient's chart and labs.  Questions were answered to the patient's satisfaction.     [place]

## 2020-07-20 NOTE — Progress Notes (Signed)
Consent for bronchoscopy obtained over the phone from niece Nadara Mustard.

## 2020-07-20 NOTE — TOC Progression Note (Signed)
Transition of Care [facility]) - Progression Note    Patient Details  Name: Audrey Norris MRN: [id number] Date of Birth: [birth year]  [facility] CM/SW Contact  Purcell Mouton, RN Phone Number: 07/20/2020, 10:04 AM  Clinical Narrative:    Pt is from [facility].    Expected Discharge Plan: Assisted Living Barriers to Discharge: No Barriers Identified  Expected Discharge Plan and Services Expected Discharge Plan: Assisted Living   Discharge Planning Services: CM Consult   Living arrangements for the past 2 months: [facility])                                       Social Determinants of Health (SDOH) Interventions    Readmission Risk Interventions No flowsheet data found.

## 2020-07-20 NOTE — Plan of Care (Signed)
  Problem: Safety: Goal: Non-violent Restraint(s) Outcome: Progressing   Problem: Nutrition: Goal: Adequate nutrition will be maintained Outcome: Progressing   Problem: Safety: Goal: Ability to remain free from injury will improve Outcome: Progressing   Problem: Elimination: Goal: Will not experience complications related to bowel motility Outcome: Progressing Goal: Will not experience complications related to urinary retention Outcome: Progressing

## 2020-07-20 NOTE — Consult Note (Signed)
Synopsis: Referred in May 2022 for lung mass by No ref. provider found  Subjective:   PATIENT ID: Audrey Norris GENDER: female DOB: 11-13-1940, MRN: [id number]  Chief Complaint  Patient presents with  . Weakness    This is a 80 year old female, history of dementia, coronary disease.  Patient was admitted to the hospital for weakness.  Currently lives at [facility] home.  She had diarrhea abdominal discomfort.  Found to have a microcytic anemia.  She had CT imaging completed upon admission to the hospital.  CT abdomen and pelvis was completed on 07/18/2020.  The CT chest revealed a right lower lobe mass concerning for malignancy with encasement of the right lower lobe bronchus as well as adenopathy in the right hilar and subcarinal regions concerning for metastatic disease.  Pulmonary critical care was consulted for recommendations and management regarding lung mass   Past Medical History:  Diagnosis Date  . Coronary artery disease   . Memory loss   . Vision abnormalities      Family History  Problem Relation Age of Onset  . Uterine cancer Mother   . Dementia Father      Past Surgical History:  Procedure Laterality Date  . VESICOVAGINAL FISTULA CLOSURE W/ TAH      Social History   Socioeconomic History  . Marital status: Divorced    Spouse name: Not on file  . Number of children: Not on file  . Years of education: Not on file  . Highest education level: Not on file  Occupational History  . Not on file  Tobacco Use  . Smoking status: Former Smoker    Types: Cigarettes  . Smokeless tobacco: Never Used  Substance and Sexual Activity  . Alcohol use: No  . Drug use: No  . Sexual activity: Not on file  Other Topics Concern  . Not on file  Social History Narrative  . Not on file   Social Determinants of Health   Financial Resource Strain: Not on file  Food Insecurity: Not on file  Transportation Needs: Not on file  Physical Activity: Not on file   Stress: Not on file  Social Connections: Not on file  Intimate Partner Violence: Not on file     No Known Allergies   @ENCMEDSTART @  ROS: Not completed   Objective:  Physical Exam Vitals reviewed.  Constitutional:      General: She is not in acute distress.    Appearance: She is well-developed.     Comments: Thin frail  HENT:     Head: Normocephalic and atraumatic.  Eyes:     General: No scleral icterus.    Conjunctiva/sclera: Conjunctivae normal.     Pupils: Pupils are equal, round, and reactive to light.  Neck:     Vascular: No JVD.     Trachea: No tracheal deviation.  Cardiovascular:     Rate and Rhythm: Normal rate and regular rhythm.     Heart sounds: Normal heart sounds. No murmur heard.   Pulmonary:     Effort: Pulmonary effort is normal. No tachypnea, accessory muscle usage or respiratory distress.     Breath sounds: No stridor. No wheezing, rhonchi or rales.  Abdominal:     General: Bowel sounds are normal. There is no distension.     Palpations: Abdomen is soft.     Tenderness: There is no abdominal tenderness.  Musculoskeletal:        General: No tenderness.     Cervical back: Neck  supple.  Lymphadenopathy:     Cervical: No cervical adenopathy.  Skin:    General: Skin is warm and dry.     Capillary Refill: Capillary refill takes less than 2 seconds.     Findings: No rash.  Neurological:     Mental Status: She is alert.     Comments: Does not follow commands, advanced dementia   Psychiatric:        Behavior: Behavior normal.      Vitals:   07/19/20 0445 07/19/20 1305 07/19/20 2025 07/20/20 0429  BP: (!) 181/83 (!) 145/74 (!) 160/85 (!) 166/84  Pulse: 83 88 86 79  Resp: 18 19 16    Temp: 97.7 F (36.5 C)  98.1 F (36.7 C) 98.5 F (36.9 C)  TempSrc: Oral  Oral Axillary  SpO2: 98%  99% 93%  Weight:      Height:       93% on RA BMI Readings from Last 3 Encounters:  07/16/20 18.99 kg/m  01/01/17 18.95 kg/m  11/03/15 18.40 kg/m    Wt Readings from Last 3 Encounters:  07/16/20 55 kg  01/01/17 54.9 kg  11/03/15 53.3 kg     CBC    Component Value Date/Time   WBC 11.1 (H) 07/20/2020 0344   RBC 4.97 07/20/2020 0344   HGB 8.5 (L) 07/20/2020 0344   HCT 31.6 (L) 07/20/2020 0344   PLT 513 (H) 07/20/2020 0344   MCV 63.6 (L) 07/20/2020 0344   MCH 17.1 (L) 07/20/2020 0344   MCHC 26.9 (L) 07/20/2020 0344   RDW 30.7 (H) 07/20/2020 0344   LYMPHSABS 1.6 07/20/2020 0344   MONOABS 1.4 (H) 07/20/2020 0344   EOSABS 0.2 07/20/2020 0344   BASOSABS 0.0 07/20/2020 0344     Chest Imaging: 07/18/2020: CT chest Lower lobe mass, mediastinal adenopathy concerning for advanced age bronchogenic carcinoma. The patient's images have been independently reviewed by me.    Pulmonary Functions Testing Results: No flowsheet data found.  FeNO:   Pathology:   Echocardiogram:   Heart Catheterization:     Assessment & Plan:    This is a 80 year old female with a right lower lobe mass, right hilar mass, right lower lobe bronchus, hilar and subcarinal adenopathy concerning for an advanced age bronchogenic carcinoma.  Advanced dementia.  Emphysema  Discussion: Patient has advanced dementia therefore consent was obtained from patient's niece listed within the chart. I called and discussed with Audrey Norris about bronchoscopy options. They are agreeable to proceed at this time. We will plan for video bronchoscopy with endobronchial ultrasound and transbronchial needle aspirations. No barriers at this time We discussed the risk benefits and alternatives of proceeding with tissue biopsy to include bleeding and pneumothorax.   Garner Nash, DO [facility] 07/20/2020 2:53 PM

## 2020-07-20 NOTE — Progress Notes (Signed)
PROGRESS NOTE  Audrey Norris EXH:[id number] DOB: Mar 14, 1941 DOA: 07/16/2020 PCP: [name], MD (Inactive)  HPI/Recap of past 24 hours: HPI from Dr [name] is a 80 y.o. female with history of advanced dementia who was brought from [name] to the ER at [facility] for progressive weakness. As per the patient's niece patient had some diarrhea and abdominal discomfort about 3 weeks ago and was taken to the ER and work-up at that time was showing likely viral gastroenteritis as per the niece. In the ER patient's hemoglobin was found to be 4.8, microcytic hypochromic picture.  High sensitive troponin was 4 and stool for occult blood was negative.  Given the severe anemia patient admitted for blood transfusion. COVID test was negative.    Today, met patient resting comfortably in bed, with restraints.   Assessment/Plan: Principal Problem:   Symptomatic anemia Active Problems:   Dementia (HCC)   Anemia   Severe symptomatic anemia Likely 2/2 advanced lung Ca Hemoglobin 4.8 on presentation, previous 14.6 but back in 2019 Anemia panel showed iron 13, saturation 3, ferritin 4, folate 8.4, vitamin B12->319 Transfused 2 PRBC on 07/17/20 Oral iron supplementation + bowel regimen Daily CBC  Likely advanced lung cancer CT chest showed right lower lobe primary malignancy extending towards the right helium, with adenopathy concerning for metastatic disease CT abdomen/pelvis, did not show any acute intra-abdominal pathology Discussed with Dr. Valeta Harms on 07/18/20, plan to biopsy on 07/20/20 at [facility] Discussed with patient's niece [name] about diagnosis, wants to proceed with biopsy, unlikely to proceed with treatment, but just wants to know diagnosis  Leukocytosis Currently afebrile, possibly reactive  UC with insignificant growth CT chest with no evidence of pneumonia Daily CBC  Dehydration Likely 2/2 poor oral inake Dry mucous  membrane, UA with high SG S/p gentle IV hydration with 1L NS on 07/19/20 Encourage oral hydration as well  Dementia Noted intermittent delirium, agitation Continue Depakote, risperidone, Zoloft, Ativan as needed Restraints as needed      Estimated body mass index is 18.99 kg/m as calculated from the following:   Height as of this encounter: 5' 7" (1.702 m).   Weight as of this encounter: 55 kg.     Code Status: DNR  Family Communication: Discussed extensively with niece [name] on 07/18/20  Disposition Plan: Status is: Inpatient  The patient will require care spanning > 2 midnights and should be moved to inpatient because: Inpatient level of care appropriate due to severity of illness  Dispo: The patient is from: SNF              Anticipated d/c is to: SNF              Patient currently is not medically stable to d/c.   Difficult to place patient No   Consultants: Pulmonary  Procedures:  None  Antimicrobials:  None  DVT prophylaxis: SCDs, due to severe anemia, planning for bronch   Objective: Vitals:   07/19/20 0445 07/19/20 1305 07/19/20 2025 07/20/20 0429  BP: (!) 181/83 (!) 145/74 (!) 160/85 (!) 166/84  Pulse: 83 88 86 79  Resp: _0 Temp: 97.7 F (36.5 C)  98.1 F (36.7 C) 98.5 F (36.9 C)  TempSrc: Oral  Oral Axillary  SpO2: 98%  99% 93%  Weight:      Height:        Intake/Output Summary (Last 24 hours) at 07/20/2020 1050 Last data filed  at 07/20/2020 0510 Gross per 24 hour  Intake 1097.96 ml  Output --  Net 1097.96 ml   Filed Weights   07/16/20 2200  Weight: 55 kg    Exam:  General: NAD, unable to engage in meaningful conversation  Cardiovascular: S1, S2 present  Respiratory: CTAB  Abdomen: Soft, nontender, nondistended, bowel sounds present  Musculoskeletal: No bilateral pedal edema noted  Skin: Normal  Psychiatry:  Unable to assess    Data Reviewed: CBC: Recent Labs  Lab 07/16/20 2215 07/18/20 0428 07/19/20 0414  07/20/20 0344  WBC 11.5* 10.9* 12.8* 11.1*  NEUTROABS 8.1* 7.3 8.0* 7.8*  HGB 4.8* 8.0* 9.4* 8.5*  HCT 19.9* 30.2* 35.3* 31.6*  MCV 56.7* 64.8* 64.1* 63.6*  PLT 698* 625* 611* 111*   Basic Metabolic Panel: Recent Labs  Lab 07/16/20 2215 07/18/20 0428 07/19/20 0414 07/20/20 0344  NA 142 142 138 139  K 4.3 4.2 4.1 4.2  CL 109 110 105 109  CO2 _0 GLUCOSE 99 91 106* 95  BUN _1 CREATININE 0.79 0.77 0.68 0.70  CALCIUM 9.4 9.5 9.7 9.3   GFR: Estimated Creatinine Clearance: 49.5 mL/min (by C-G formula based on SCr of 0.7 mg/dL). Liver Function Tests: Recent Labs  Lab 07/16/20 2215  AST 12*  ALT 7  ALKPHOS 67  BILITOT 0.3  PROT 6.6  ALBUMIN 3.5   No results for input(s): LIPASE, AMYLASE in the last 168 hours. No results for input(s): AMMONIA in the last 168 hours. Coagulation Profile: No results for input(s): INR, PROTIME in the last 168 hours. Cardiac Enzymes: No results for input(s): CKTOTAL, CKMB, CKMBINDEX, TROPONINI in the last 168 hours. BNP (last 3 results) No results for input(s): PROBNP in the last 8760 hours. HbA1C: No results for input(s): HGBA1C in the last 72 hours. CBG: No results for input(s): GLUCAP in the last 168 hours. Lipid Profile: No results for input(s): CHOL, HDL, LDLCALC, TRIG, CHOLHDL, LDLDIRECT in the last 72 hours. Thyroid Function Tests: No results for input(s): TSH, T4TOTAL, FREET4, T3FREE, THYROIDAB in the last 72 hours. Anemia Panel: No results for input(s): VITAMINB12, FOLATE, FERRITIN, TIBC, IRON, RETICCTPCT in the last 72 hours. Urine analysis:    Component Value Date/Time   COLORURINE STRAW (A) 07/18/2020 1312   APPEARANCEUR CLEAR 07/18/2020 1312   LABSPEC >1.046 (H) 07/18/2020 1312   PHURINE 8.0 07/18/2020 1312   GLUCOSEU NEGATIVE 07/18/2020 1312   HGBUR SMALL (A) 07/18/2020 1312   BILIRUBINUR NEGATIVE 07/18/2020 1312   KETONESUR NEGATIVE 07/18/2020 1312   PROTEINUR NEGATIVE 07/18/2020 1312    UROBILINOGEN 1.0 01/17/2014 1924   NITRITE NEGATIVE 07/18/2020 1312   LEUKOCYTESUR NEGATIVE 07/18/2020 1312   Sepsis Labs: _2 (procalcitonin:4,lacticidven:4)  ) Recent Results (from the past 240 hour(s))  Resp Panel by RT-PCR (Flu A&B, Covid) Nasopharyngeal Swab     Status: None   Collection Time: 07/16/20 10:15 PM   Specimen: Nasopharyngeal Swab; Nasopharyngeal(NP) swabs in vial transport medium  Result Value Ref Range Status   SARS Coronavirus 2 by RT PCR NEGATIVE NEGATIVE Final    Comment: (NOTE) SARS-CoV-2 target nucleic acids are NOT DETECTED.  The SARS-CoV-2 RNA is generally detectable in upper respiratory specimens during the acute phase of infection. The lowest concentration of SARS-CoV-2 viral copies this assay can detect is 138 copies/mL. A negative result does not preclude SARS-Cov-2 infection and should not be used as the sole basis for treatment or other patient management decisions. A negative result may occur with  improper specimen collection/handling, submission of specimen other than nasopharyngeal swab, presence of viral mutation(s) within the areas targeted by this assay, and inadequate number of viral copies(<138 copies/mL). A negative result must be combined with clinical observations, patient history, and epidemiological information. The expected result is Negative.  Fact Sheet for Patients:  EntrepreneurPulse.com.au  Fact Sheet for Healthcare Providers:  IncredibleEmployment.be  This test is no t yet approved or cleared by the [place] FDA and  has been authorized for detection and/or diagnosis of SARS-CoV-2 by FDA under an Emergency Use Authorization (EUA). This EUA will remain  in effect (meaning this test can be used) for the duration of the COVID-19 declaration under Section 564(b)(1) of the Act, 21 U.S.C.section 360bbb-3(b)(1), unless the authorization is terminated  or revoked sooner.        Influenza A by PCR NEGATIVE NEGATIVE Final   Influenza B by PCR NEGATIVE NEGATIVE Final    Comment: (NOTE) The Xpert Xpress SARS-CoV-2/FLU/RSV plus assay is intended as an aid in the diagnosis of influenza from Nasopharyngeal swab specimens and should not be used as a sole basis for treatment. Nasal washings and aspirates are unacceptable for Xpert Xpress SARS-CoV-2/FLU/RSV testing.  Fact Sheet for Patients: EntrepreneurPulse.com.au  Fact Sheet for Healthcare Providers: IncredibleEmployment.be  This test is not yet approved or cleared by the [place] FDA and has been authorized for detection and/or diagnosis of SARS-CoV-2 by FDA under an Emergency Use Authorization (EUA). This EUA will remain in effect (meaning this test can be used) for the duration of the COVID-19 declaration under Section 564(b)(1) of the Act, 21 U.S.C. section 360bbb-3(b)(1), unless the authorization is terminated or revoked.  Performed at [facility], [address] [place], [place] [address]   Urine culture     Status: Abnormal   Collection Time: 07/18/20  1:12 PM   Specimen: Urine, Clean Catch  Result Value Ref Range Status   Specimen Description   Final    URINE, CLEAN CATCH Performed at [facility], [place] [address] [place], [place] [address]    Special Requests   Final    NONE Performed at [facility], [place] [address] [place], [place] [address]    Culture (A)  Final    <10,000 COLONIES/mL INSIGNIFICANT GROWTH Performed at [facility] [address], [place], [place] [address]    Report Status 07/19/2020 FINAL  Final      Studies: No results found.  Scheduled Meds: . divalproex  125 mg Oral BID  . ferrous sulfate  325 mg Oral Q breakfast  . risperiDONE  0.5 mg Oral BID  . senna-docusate  1 tablet Oral QHS  . sertraline  50 mg Oral Daily  . sucralfate  1 g Oral TID     Continuous Infusions:    LOS: 3 days     Alma Friendly, MD [facility]  If 7PM-7AM, please contact night-coverage [email or web address] 07/20/2020, 10:50 AM

## 2020-07-20 NOTE — Anesthesia Procedure Notes (Signed)
Procedure Name: Intubation Date/Time: 07/20/2020 3:16 PM Performed by: Moshe Salisbury, CRNA Pre-anesthesia Checklist: Patient identified, Emergency Drugs available, Suction available and Patient being monitored Patient Re-evaluated:Patient Re-evaluated prior to induction Oxygen Delivery Method: Circle System Utilized Preoxygenation: Pre-oxygenation with 100% oxygen Induction Type: IV induction Ventilation: Mask ventilation without difficulty Laryngoscope Size: Mac and 3 Grade View: Grade I Tube type: Oral Tube size: 9.0 mm Number of attempts: 1 Airway Equipment and Method: Stylet Placement Confirmation: ETT inserted through vocal cords under direct vision,  positive ETCO2 and breath sounds checked- equal and bilateral Secured at: 18 cm Tube secured with: Tape Dental Injury: Teeth and Oropharynx as per pre-operative assessment

## 2020-07-20 NOTE — Plan of Care (Signed)
Patient refuses offer of drink/food after returning from bronchoscopy.

## 2020-07-20 NOTE — Anesthesia Preprocedure Evaluation (Signed)
Anesthesia Evaluation  Patient identified by MRN, date of birth, ID band Patient confused and Patient unresponsive    Reviewed: Allergy & Precautions, NPO status , Patient's Chart, lab work & pertinent test results  Airway Mallampati: II  TM Distance: >3 FB     Dental  (+) Edentulous Upper, Edentulous Lower   Pulmonary former smoker,    breath sounds clear to auscultation       Cardiovascular  Rhythm:Regular Rate:Normal     Neuro/Psych    GI/Hepatic   Endo/Other    Renal/GU      Musculoskeletal   Abdominal   Peds  Hematology   Anesthesia Other Findings   Reproductive/Obstetrics                             Anesthesia Physical Anesthesia Plan  ASA: III  Anesthesia Plan: General   Post-op Pain Management:    Induction: Intravenous  PONV Risk Score and Plan: Ondansetron and Dexamethasone  Airway Management Planned: Oral ETT  Additional Equipment:   Intra-op Plan:   Post-operative Plan:   Informed Consent: I have reviewed the patients History and Physical, chart, labs and discussed the procedure including the risks, benefits and alternatives for the proposed anesthesia with the patient or authorized representative who has indicated his/her understanding and acceptance.       Plan Discussed with: CRNA and Anesthesiologist  Anesthesia Plan Comments:         Anesthesia Quick Evaluation

## 2020-07-20 NOTE — Transfer of Care (Signed)
Immediate Anesthesia Transfer of Care Note  Patient: Audrey Norris  Procedure(s) Performed: VIDEO BRONCHOSCOPY WITH ENDOBRONCHIAL NAVIGATION (Right ) VIDEO BRONCHOSCOPY WITH ENDOBRONCHIAL ULTRASOUND (Bilateral ) FINE NEEDLE ASPIRATION (FNA) LINEAR BRONCHIAL BIOPSIES BRONCHIAL BRUSHINGS CRYOTHERAPY  Patient Location: Endoscopy Unit  Anesthesia Type:General  Level of Consciousness: responds to stimulation  Airway & Oxygen Therapy: Patient Spontanous Breathing and Patient connected to face mask oxygen  Post-op Assessment: Report given to RN and Post -op Vital signs reviewed and stable  Post vital signs: Reviewed and stable  Last Vitals:  Vitals Value Taken Time  BP 207/46 07/20/20 1609  Temp    Pulse 84 07/20/20 1611  Resp 14 07/20/20 1611  SpO2 100 % 07/20/20 1611  Vitals shown include unvalidated device data.  Last Pain:  Vitals:   07/20/20 0429  TempSrc: Axillary  PainSc:          Complications: No complications documented.

## 2020-07-20 NOTE — H&P (View-Only) (Signed)
Synopsis: Referred in May 2022 for lung mass by No ref. provider found  Subjective:   PATIENT ID: Audrey Norris GENDER: female DOB: August 19, 1940, MRN: [id number]  Chief Complaint  Patient presents with  . Weakness    This is a 80 year old female, history of dementia, coronary disease.  Patient was admitted to the hospital for weakness.  Currently lives at [facility].  She had diarrhea abdominal discomfort.  Found to have a microcytic anemia.  She had CT imaging completed upon admission to the hospital.  CT abdomen and pelvis was completed on 07/18/2020.  The CT chest revealed a right lower lobe mass concerning for malignancy with encasement of the right lower lobe bronchus as well as adenopathy in the right hilar and subcarinal regions concerning for metastatic disease.  Pulmonary critical care was consulted for recommendations and management regarding lung mass   Past Medical History:  Diagnosis Date  . Coronary artery disease   . Memory loss   . Vision abnormalities      Family History  Problem Relation Age of Onset  . Uterine cancer Mother   . Dementia Father      Past Surgical History:  Procedure Laterality Date  . VESICOVAGINAL FISTULA CLOSURE W/ TAH      Social History   Socioeconomic History  . Marital status: Divorced    Spouse name: Not on file  . Number of children: Not on file  . Years of education: Not on file  . Highest education level: Not on file  Occupational History  . Not on file  Tobacco Use  . Smoking status: Former Smoker    Types: Cigarettes  . Smokeless tobacco: Never Used  Substance and Sexual Activity  . Alcohol use: No  . Drug use: No  . Sexual activity: Not on file  Other Topics Concern  . Not on file  Social History Narrative  . Not on file   Social Determinants of Health   Financial Resource Strain: Not on file  Food Insecurity: Not on file  Transportation Needs: Not on file  Physical Activity: Not on file   Stress: Not on file  Social Connections: Not on file  Intimate Partner Violence: Not on file     No Known Allergies   @ENCMEDSTART @  ROS: Not completed   Objective:  Physical Exam Vitals reviewed.  Constitutional:      General: She is not in acute distress.    Appearance: She is well-developed.     Comments: Thin frail  HENT:     Head: Normocephalic and atraumatic.  Eyes:     General: No scleral icterus.    Conjunctiva/sclera: Conjunctivae normal.     Pupils: Pupils are equal, round, and reactive to light.  Neck:     Vascular: No JVD.     Trachea: No tracheal deviation.  Cardiovascular:     Rate and Rhythm: Normal rate and regular rhythm.     Heart sounds: Normal heart sounds. No murmur heard.   Pulmonary:     Effort: Pulmonary effort is normal. No tachypnea, accessory muscle usage or respiratory distress.     Breath sounds: No stridor. No wheezing, rhonchi or rales.  Abdominal:     General: Bowel sounds are normal. There is no distension.     Palpations: Abdomen is soft.     Tenderness: There is no abdominal tenderness.  Musculoskeletal:        General: No tenderness.     Cervical back: Neck  supple.  Lymphadenopathy:     Cervical: No cervical adenopathy.  Skin:    General: Skin is warm and dry.     Capillary Refill: Capillary refill takes less than 2 seconds.     Findings: No rash.  Neurological:     Mental Status: She is alert.     Comments: Does not follow commands, advanced dementia   Psychiatric:        Behavior: Behavior normal.      Vitals:   07/19/20 0445 07/19/20 1305 07/19/20 2025 07/20/20 0429  BP: (!) 181/83 (!) 145/74 (!) 160/85 (!) 166/84  Pulse: 83 88 86 79  Resp: 18 19 16    Temp: 97.7 F (36.5 C)  98.1 F (36.7 C) 98.5 F (36.9 C)  TempSrc: Oral  Oral Axillary  SpO2: 98%  99% 93%  Weight:      Height:       93% on RA BMI Readings from Last 3 Encounters:  07/16/20 18.99 kg/m  01/01/17 18.95 kg/m  11/03/15 18.40 kg/m    Wt Readings from Last 3 Encounters:  07/16/20 55 kg  01/01/17 54.9 kg  11/03/15 53.3 kg     CBC    Component Value Date/Time   WBC 11.1 (H) 07/20/2020 0344   RBC 4.97 07/20/2020 0344   HGB 8.5 (L) 07/20/2020 0344   HCT 31.6 (L) 07/20/2020 0344   PLT 513 (H) 07/20/2020 0344   MCV 63.6 (L) 07/20/2020 0344   MCH 17.1 (L) 07/20/2020 0344   MCHC 26.9 (L) 07/20/2020 0344   RDW 30.7 (H) 07/20/2020 0344   LYMPHSABS 1.6 07/20/2020 0344   MONOABS 1.4 (H) 07/20/2020 0344   EOSABS 0.2 07/20/2020 0344   BASOSABS 0.0 07/20/2020 0344     Chest Imaging: 07/18/2020: CT chest Lower lobe mass, mediastinal adenopathy concerning for advanced age bronchogenic carcinoma. The patient's images have been independently reviewed by me.    Pulmonary Functions Testing Results: No flowsheet data found.  FeNO:   Pathology:   Echocardiogram:   Heart Catheterization:     Assessment & Plan:    This is a 80 year old female with a right lower lobe mass, right hilar mass, right lower lobe bronchus, hilar and subcarinal adenopathy concerning for an advanced age bronchogenic carcinoma.  Advanced dementia.  Emphysema  Discussion: Patient has advanced dementia therefore consent was obtained from patient's niece listed within the chart. I called and discussed with Audrey Norris about bronchoscopy options. They are agreeable to proceed at this time. We will plan for video bronchoscopy with endobronchial ultrasound and transbronchial needle aspirations. No barriers at this time We discussed the risk benefits and alternatives of proceeding with tissue biopsy to include bleeding and pneumothorax.   Audrey Nash, DO [facility] 07/20/2020 2:53 PM

## 2020-07-21 DIAGNOSIS — D649 Anemia, unspecified: Secondary | ICD-10-CM | POA: Diagnosis not present

## 2020-07-21 LAB — CBC WITH DIFFERENTIAL/PLATELET
Abs Immature Granulocytes: 0.05 10*3/uL (ref 0.00–0.07)
Basophils Absolute: 0 10*3/uL (ref 0.0–0.1)
Basophils Relative: 0 %
Eosinophils Absolute: 0 10*3/uL (ref 0.0–0.5)
Eosinophils Relative: 0 %
HCT: 34.4 % — ABNORMAL LOW (ref 36.0–46.0)
Hemoglobin: 8.8 g/dL — ABNORMAL LOW (ref 12.0–15.0)
Immature Granulocytes: 1 %
Lymphocytes Relative: 13 %
Lymphs Abs: 1.4 10*3/uL (ref 0.7–4.0)
MCH: 16.9 pg — ABNORMAL LOW (ref 26.0–34.0)
MCHC: 25.6 g/dL — ABNORMAL LOW (ref 30.0–36.0)
MCV: 66 fL — ABNORMAL LOW (ref 80.0–100.0)
Monocytes Absolute: 1.3 10*3/uL — ABNORMAL HIGH (ref 0.1–1.0)
Monocytes Relative: 12 %
Neutro Abs: 8.2 10*3/uL — ABNORMAL HIGH (ref 1.7–7.7)
Neutrophils Relative %: 74 %
Platelets: 518 10*3/uL — ABNORMAL HIGH (ref 150–400)
RBC: 5.21 MIL/uL — ABNORMAL HIGH (ref 3.87–5.11)
RDW: 31.1 % — ABNORMAL HIGH (ref 11.5–15.5)
WBC: 11 10*3/uL — ABNORMAL HIGH (ref 4.0–10.5)
nRBC: 0 % (ref 0.0–0.2)

## 2020-07-21 LAB — BASIC METABOLIC PANEL
Anion gap: 9 (ref 5–15)
BUN: 25 mg/dL — ABNORMAL HIGH (ref 8–23)
CO2: 23 mmol/L (ref 22–32)
Calcium: 9.8 mg/dL (ref 8.9–10.3)
Chloride: 108 mmol/L (ref 98–111)
Creatinine, Ser: 0.8 mg/dL (ref 0.44–1.00)
GFR, Estimated: >60 mL/min (ref >60)
Glucose, Bld: 97 mg/dL (ref 70–99)
Potassium: 4.2 mmol/L (ref 3.5–5.1)
Sodium: 140 mmol/L (ref 135–145)

## 2020-07-21 LAB — CYTOLOGY - NON PAP

## 2020-07-21 MED ORDER — RISPERIDONE 1 MG PO TABS
1.0000 mg | ORAL_TABLET | Freq: Two times a day (BID) | ORAL | Status: DC
  Administered 2020-07-21: 1 mg via ORAL
  Filled 2020-07-21 (×2): qty 1

## 2020-07-21 NOTE — Progress Notes (Signed)
[facility] Progress Note  Patient: Audrey Norris    UJW:[id number]  DOA: 07/16/2020     Date of Service: the patient was seen and examined on 07/21/2020  Brief hospital course: Past medical history of advanced dementia, CAD.  Presents with complaints of generalized weakness.  Found to have anemia SP 2 PRBC transfusion following which work-up was unremarkable. Also had right lung consolidation/malignancy.  Underwent bronchoscopy.  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 Currently plan is comfort care approach, arrange for safe discharge.  Assessment and Plan: 1.  Severe iron deficiency anemia, likely from poor p.o. intake No acute bleeding. Hemoglobin 4.8 on presentation. Previously 14.6. Currently after 2 PRBC hemoglobin remained stable around 8. No acute bleeding seen here in the hospital. Monitor.  2.  Likely stage III lung cancer Chest x-ray showed right-sided mass, CT chest shows right lower lobe primary malignancy 7 x 7 cm in size. CT abdomen negative for any acute metastasis. Pulmonary was consulted underwent bronchoscopy on 5/3. Pulmonary pathology evaluation shows evidence of squamous cell carcinoma. Discussed with family, not a good surgical candidate, not a candidate for chemotherapy with her advanced dementia.  3.  Alzheimer's dementia Currently in memory care unit. Intermittent delirium. Currently Depakote and risperidone.  As needed Ativan.  On Zoloft.  Restraint discontinued on 5/4.  4.  Goals of care conversation Discussed with niece on phone. Currently not a good candidate for any chemotherapy for her advanced cancer.  Not a good candidate for any therapy for her dementia. Prognosis guarded although not a candidate for residential hospice. Recommend considering hospice on discharge. Family interested although would like to continue memory care unit on discharge. Will consult social worker for further assistance.  5.   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  Currently family planning for comfort care, no further work-up.  Diet: Regular diet, soft DVT Prophylaxis:   SCDs Start: 07/17/20 0506    Advance goals of care discussion: DNR  Family Communication: no family was present at bedside, at the time of interview.  Opportunity was given to ask question and all questions were answered satisfactorily.   Disposition:  Status is: Inpatient  Remains inpatient appropriate because:Unsafe d/c plan   Dispo: The patient is from: Memory care unit              Anticipated d/c is to: Memory care unit with palliative care/hospice              Patient currently is not medically stable to d/c.   Difficult to place patient No  Subjective: No nausea no vomiting.  No fever no chills.  Intermittently agitating.  Trying to get out of the bed.  Physical Exam:  General: Appear in mild distress, no Rash; Oral Mucosa Clear, moist. no Abnormal Neck Mass Or lumps, Conjunctiva normal  Cardiovascular: S1 and S2 Present, no Murmur, Respiratory: good respiratory effort, Bilateral Air entry present and CTA, no Crackles, no wheezes Abdomen: Bowel Sound present, Soft and no tenderness Extremities: no Pedal edema Neurology: alert and not oriented to time, place, and person affect appropriate. no new focal deficit Gait not checked due to patient safety concerns  Vitals:   07/20/20 1806 07/20/20 1931 07/21/20 0323 07/21/20 1425  BP:  140/66 127/65 (!) 123/59  Pulse:  76 75 71  Resp:  18 15 18   Temp:  (!) 97.5 F (36.4 C) 98.3 F (36.8 C) 98.4 F (36.9 C)  TempSrc:  Oral Oral Oral  SpO2: 91% 99%  95%  Weight:      Height:        Intake/Output Summary (Last 24 hours) at 07/21/2020 2028 Last data filed at 07/21/2020 1800 Gross per 24 hour  Intake 680 ml  Output 0 ml  Net 680 ml   Filed Weights   07/16/20 2200  Weight: 55 kg    Data Reviewed: I have personally reviewed and  interpreted daily labs, tele strips, imaging. I reviewed all nursing notes, pharmacy notes, vitals, pertinent old records I have discussed plan of care as described above with RN and patient/family.  CBC: Recent Labs  Lab 07/16/20 2215 07/18/20 0428 07/19/20 0414 07/20/20 0344 07/21/20 0332  WBC 11.5* 10.9* 12.8* 11.1* 11.0*  NEUTROABS 8.1* 7.3 8.0* 7.8* 8.2*  HGB 4.8* 8.0* 9.4* 8.5* 8.8*  HCT 19.9* 30.2* 35.3* 31.6* 34.4*  MCV 56.7* 64.8* 64.1* 63.6* 66.0*  PLT 698* 625* 611* 513* 606*   Basic Metabolic Panel: Recent Labs  Lab 07/16/20 2215 07/18/20 0428 07/19/20 0414 07/20/20 0344 07/21/20 0332  NA 142 142 138 139 140  K 4.3 4.2 4.1 4.2 4.2  CL 109 110 105 109 108  CO2 25 28 25 22 23   GLUCOSE 99 91 106* 95 97  BUN 13 13 10 14  25*  CREATININE 0.79 0.77 0.68 0.70 0.80  CALCIUM 9.4 9.5 9.7 9.3 9.8    Studies: No results found.  Scheduled Meds: . divalproex  125 mg Oral BID  . ferrous sulfate  325 mg Oral Q breakfast  . risperiDONE  1 mg Oral BID  . senna-docusate  1 tablet Oral QHS  . sertraline  50 mg Oral Daily  . sucralfate  1 g Oral TID   Continuous Infusions: PRN Meds: acetaminophen, lip balm, loperamide, LORazepam  Time spent: 35 minutes  Author: Berle Mull, MD [facility] 07/21/2020 8:28 PM  To reach On-call, see care teams to locate the attending and reach out via [email or web address]. Between 7PM-7AM, please contact night-coverage If you still have difficulty reaching the attending provider, please page the [facility] (Director on Call) for [facility] on amion for assistance.

## 2020-07-21 NOTE — Evaluation (Addendum)
Physical Therapy Evaluation-1x Patient Details Name: Audrey Norris MRN: [id number] DOB: 04/05/1940 Today's Date: 07/21/2020   History of Present Illness  80 yo female admitted with anemia. CT(+) R lung malignancy. Hx of adv dementia, CAD  Clinical Impression  On eval, pt was Min A for mobility. She walked around the room with 1 HHA + "cruising/furniture walk". Unsteady/fall risk. Multimodal cueing and convincing from RN and PT required for participation. Pt should be able to return to her ALF as long as staff af ALF can provide current level of care. Pt is likely at a custodial level of care at baseline. 1x PT eval-will sign off. Recommend OOB/mobility with nursing as able during hospital stay. Please reorder if needs change. Thanks.     Follow Up Recommendations Supervision/Assistance - 24 hour (return to ALF)    Equipment Recommendations  None recommended by PT    Recommendations for Other Services       Precautions / Restrictions Precautions Precautions: Fall Precaution Comments: incontinent Restrictions Weight Bearing Restrictions: No      Mobility  Bed Mobility Overal bed mobility: Needs Assistance Bed Mobility: Supine to Sit;Sit to Supine     Supine to sit: Min assist Sit to supine: Min guard   General bed mobility comments: Assist to get to EOB. Increased time. Multimodal cueing. Pt initially resistant but eventually complied with therapist's and nurse's request for participation    Transfers Overall transfer level: Needs assistance   Transfers: Sit to/from Stand Sit to Stand: Min assist         General transfer comment: Assist to rise, steady, control descent. Multimodal cueing required.  Ambulation/Gait Ambulation/Gait assistance: Min assist Gait Distance (Feet): 15 Feet Assistive device: 1 person hand held assist (+ furniture walking) Gait Pattern/deviations: Narrow base of support     General Gait Details: Pt walked around bed with 1 HHA from  therapist and while holding on to objects in the room. Unsteady and at risk for falls.  Stairs            Wheelchair Mobility    Modified Rankin (Stroke Patients Only)       Balance Overall balance assessment: Needs assistance           Standing balance-Leahy Scale: Poor                               Pertinent Vitals/Pain Pain Assessment: Faces Faces Pain Scale: No hurt    Home Living Family/patient expects to be discharged to:: Assisted living                      Prior Function           Comments: unsure of PLOF. pt is a unreliable historian. she is from an ALF     Hand Dominance        Extremity/Trunk Assessment   Upper Extremity Assessment Upper Extremity Assessment: Generalized weakness    Lower Extremity Assessment Lower Extremity Assessment: Generalized weakness    Cervical / Trunk Assessment Cervical / Trunk Assessment: Normal  Communication   Communication: HOH;Receptive difficulties  Cognition Arousal/Alertness: Awake/alert Behavior During Therapy: WFL for tasks assessed/performed;Impulsive Overall Cognitive Status: History of cognitive impairments - at baseline  General Comments      Exercises     Assessment/Plan    PT Assessment Patent does not need any further PT services (recommend OOB with nursing as able)  PT Problem List         PT Treatment Interventions      PT Goals (Current goals can be found in the Care Plan section)  Acute Rehab PT Goals Patient Stated Goal: pt unable to state PT Goal Formulation: Patient unable to participate in goal setting    Frequency     Barriers to discharge        Co-evaluation               AM-PAC PT "6 Clicks" Mobility  Outcome Measure Help needed turning from your back to your side while in a flat bed without using bedrails?: A Little Help needed moving from lying on your back to sitting on  the side of a flat bed without using bedrails?: A Little Help needed moving to and from a bed to a chair (including a wheelchair)?: A Little Help needed standing up from a chair using your arms (e.g., wheelchair or bedside chair)?: A Little Help needed to walk in hospital room?: A Little Help needed climbing 3-5 steps with a railing? : A Lot 6 Click Score: 17    End of Session   Activity Tolerance: Patient tolerated treatment well Patient left: in bed;with call bell/phone within reach;with bed alarm set;with restraints reapplied (posey belt + mittens)   PT Visit Diagnosis: Unsteadiness on feet (R26.81)    Time: 5449-2010 PT Time Calculation (min) (ACUTE ONLY): 22 min   Charges:   PT Evaluation $PT Eval Moderate Complexity: 1 Mod     Doreatha Massed, PT Acute Rehabilitation  Office: [phone] Pager: [phone]

## 2020-07-21 NOTE — Progress Notes (Signed)
Spoke with Cherene Altes and pt spoke with niece as well. Update provided. SRP, RN

## 2020-07-21 NOTE — Anesthesia Postprocedure Evaluation (Signed)
Anesthesia Post Note  Patient: Audrey Norris  Procedure(s) Performed: VIDEO BRONCHOSCOPY WITH ENDOBRONCHIAL NAVIGATION (Right ) VIDEO BRONCHOSCOPY WITH ENDOBRONCHIAL ULTRASOUND (Bilateral ) FINE NEEDLE ASPIRATION (FNA) LINEAR BRONCHIAL BIOPSIES BRONCHIAL BRUSHINGS CRYOTHERAPY     Patient location during evaluation: PACU Anesthesia Type: General Level of consciousness: awake and alert Pain management: pain level controlled Vital Signs Assessment: post-procedure vital signs reviewed and stable Respiratory status: spontaneous breathing, nonlabored ventilation, respiratory function stable and patient connected to nasal cannula oxygen Cardiovascular status: blood pressure returned to baseline and stable Postop Assessment: no apparent nausea or vomiting Anesthetic complications: no   No complications documented.  Last Vitals:  Vitals:   07/20/20 1931 07/21/20 0323  BP: 140/66 127/65  Pulse: 76 75  Resp: 18 15  Temp: (!) 36.4 C 36.8 C  SpO2: 99%     Last Pain:  Vitals:   07/21/20 0323  TempSrc: Oral  PainSc:                  Merlinda Frederick

## 2020-07-21 NOTE — Progress Notes (Signed)
Pt calmer and resting in bed, offered food and BR need at required. Waist bed removed and placed call bell near pt and instructed how to use to call nurse and NT, bed alarm set at will continue to assess and round on pt to maintain safety. MD updated waist restraints discontinued. SRP, RN

## 2020-07-21 NOTE — Progress Notes (Signed)
Covid testing complete for possible d/c to facility tomorrow 5/5. SRP, RN

## 2020-07-21 NOTE — Plan of Care (Signed)
  Problem: Safety: Goal: Non-violent Restraint(s) Outcome: Completed/Met

## 2020-07-22 ENCOUNTER — Encounter (HOSPITAL_COMMUNITY): Payer: Self-pay | Admitting: Pulmonary Disease

## 2020-07-22 DIAGNOSIS — D649 Anemia, unspecified: Secondary | ICD-10-CM | POA: Diagnosis not present

## 2020-07-22 LAB — SARS CORONAVIRUS 2 (TAT 6-24 HRS): SARS Coronavirus 2: NEGATIVE

## 2020-07-22 NOTE — Discharge Summary (Addendum)
[facility] Discharge Summary   Patient: Audrey Norris ZOX:[id number]  PCP: [name], MD (Inactive)  Date of admission: 07/16/2020   Date of discharge:  07/22/2020     Discharge Diagnoses:  Principal Problem:   Symptomatic anemia Active Problems:   Dementia ([place])   Anemia   Lung mass   Adenopathy   Admitted From: ALF/memory care unit Disposition:  ALF/ILF with hospice referral  Recommendations for Outpatient Follow-up:  1. PCP: refer and establish care with hospice 2. Follow up LABS/TEST:  Lung biopsy 3. New Meds: none 4. Changed meds: none 5. Stopped meds: none   Follow-up Information    Blount, Denton Meek, MD. Schedule an appointment as soon as possible for a visit in 1 week(s).   Specialty: Family Medicine Contact information: [place] [address] [phone]        Hospice. Schedule an appointment as soon as possible for a visit in 1 week(s).   Why: establish care             Discharge Instructions    Diet - low sodium heart healthy   Complete by: As directed    Increase activity slowly   Complete by: As directed       Diet recommendation: Regular diet  Activity: The patient is advised to gradually reintroduce usual activities, as tolerated  Discharge Condition: stable  Code Status: DNR   History of present illness: As per the H and P dictated on admission, "Audrey Norris is a 80 y.o. female with history of advanced dementia was brought to the ER at [facility] for weakness.  As per the patient's niece patient had some diarrhea and abdominal discomfort about 3 weeks ago and was taken to the ER and work-up at that time was showing likely viral gastroenteritis as per the niece.  Today when patient was feeling weak patient was brought to the ER.  ED Course: In the ER patient's hemoglobin was found to be around 4.8 and microcytic hypochromic picture.  High sensitive troponin was 4 and  stool for occult blood was negative.  Given the severe anemia patient admitted for blood transfusion.  Anemia panel is pending.  COVID test was negative."  Hospital Course:  Summary of her active problems in the hospital is as following.   1.  Severe iron deficiency anemia, likely from poor p.o. intake No acute bleeding. Hemoglobin 4.8 on presentation. Previously 14.6. Currently after 2 PRBC hemoglobin remained stable around 8. No acute bleeding seen here in the hospital.  2.  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 CT abdomen negative for any acute metastasis. Pulmonary was consulted underwent bronchoscopy on 5/3. Pulmonary pathology evaluation shows evidence of squamous cell carcinoma. Discussed with family, not a good surgical candidate, not a candidate for chemotherapy with her advanced dementia. Recommended comfort care and hospice referral. Family agrees with that.   3.  Alzheimer's dementia Currently in memory care unit. Intermittent delirium. Currently Depakote and risperidone.  On Zoloft.   4.  Goals of care conversation Discussed with niece on phone. Currently not a good candidate for any chemotherapy for her advanced cancer.  Not a good candidate for any therapy for her dementia. Prognosis guarded although not a candidate for residential hospice. Recommend considering hospice on discharge. Family interested although would like to continue memory care unit  on discharge. Prognosis less than 6 months due to dementia and cancer  5.  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  Currently family planning for comfort care, no further work-up.  On the day of the discharge the patient's vitals were stable, and no other acute medical condition were reported by patient. The patient was felt safe to be discharge at ALF/ILF with no therapy  needed on discharge.  Consultants: PCCM  Procedures: Bronchoscopy with Biopsy  DISCHARGE MEDICATION: Allergies as of 07/22/2020   No Known Allergies     Medication List    STOP taking these medications   memantine 28 MG Cp24 24 hr capsule Commonly known as: NAMENDA XR   memantine 7 MG Cp24 24 hr capsule Commonly known as: NAMENDA XR     TAKE these medications   acetaminophen 500 MG tablet Commonly known as: TYLENOL Take 500 mg by mouth every 6 (six) hours as needed for mild pain, fever or headache.   aluminum-magnesium hydroxide-simethicone 161-096-04 MG/5ML Susp Commonly known as: MAALOX Take 30 mLs by mouth every 6 (six) hours as needed (indigestion/heartburn).   aspirin 81 MG chewable tablet Chew 81 mg by mouth daily.   cholecalciferol 1000 units tablet Commonly known as: VITAMIN D Take 1,000 Units by mouth daily.   divalproex 125 MG capsule Commonly known as: DEPAKOTE SPRINKLE Take 125 mg by mouth 2 (two) times daily.   donepezil 10 MG tablet Commonly known as: ARICEPT TAKE 1 TABLET BY MOUTH EVERY DAY   guaiFENesin 100 MG/5ML liquid Commonly known as: ROBITUSSIN Take 200 mg by mouth every 6 (six) hours as needed for cough.   loperamide 2 MG capsule Commonly known as: IMODIUM Take 2 mg by mouth as needed for diarrhea or loose stools.   magnesium hydroxide 400 MG/5ML suspension Commonly known as: MILK OF MAGNESIA Take 30 mLs by mouth at bedtime as needed for mild constipation.   neomycin-bacitracin-polymyxin Oint Commonly known as: NEOSPORIN Apply 1 application topically as needed for wound care.   potassium chloride 20 MEQ packet Commonly known as: KLOR-CON Take 40 mEq by mouth daily. Mix 2 packets with liquid   risperiDONE 0.5 MG tablet Commonly known as: RISPERDAL Take 0.5 mg by mouth 2 (two) times daily.   sertraline 50 MG tablet Commonly known as: ZOLOFT TAKE 1 TABLET BY MOUTH EVERY DAY   sucralfate 1 g tablet Commonly known as:  CARAFATE Take 1 g by mouth 3 (three) times daily.       Discharge Exam: Filed Weights   07/16/20 2200  Weight: 55 kg   Vitals:   07/21/20 2113 07/22/20 0509  BP: (!) 156/79 (!) 142/91  Pulse: 78 79  Resp: 18 20  Temp: 97.9 F (36.6 C) 98.5 F (36.9 C)  SpO2: 92%    General: Appear in no distress, no Rash; Oral Mucosa Clear, moist. no Abnormal Neck Mass Or lumps, Conjunctiva normal  Cardiovascular: S1 and S2 Present, no Murmur Respiratory: good respiratory effort, Bilateral Air entry present and Occasional  Crackles, no wheezes Abdomen: Bowel Sound present, Soft and no tenderness Extremities: no Pedal edema Neurology: alert and not oriented to time, place, and person affect flat in affect. no new focal deficit  The results of significant diagnostics from this hospitalization (including imaging, microbiology, ancillary and laboratory) are listed below for reference.    Significant Diagnostic Studies: CT CHEST W CONTRAST  Result Date: 07/18/2020 CLINICAL DATA:  Right lower lobe consolidation versus mass with enlarged right hilar lymph  nodes. EXAM: CT CHEST WITH CONTRAST TECHNIQUE: Multidetector CT imaging of the chest was performed during intravenous contrast administration. CONTRAST:  13mL OMNIPAQUE IOHEXOL 300 MG/ML  SOLN COMPARISON:  CT the abdomen and pelvis July 17, 2020. FINDINGS: Cardiovascular: Three-vessel calcified coronary artery disease. Mild cardiomegaly. Calcified atherosclerosis in the nonaneurysmal thoracic aorta. Central pulmonary arteries are normal. Mediastinum/Nodes: Several small nodules are seen in the thyroid lobes bilaterally. The largest measures 11 mm. The esophagus is normal. Tiny pleural effusions. No pericardial effusion. There is an enlarged lymph node in the subcarinal region, adjacent to the right mainstem bronchus measuring 1.4 cm on series 2, image 79. There is adenopathy in the right hilar and infrahilar region as well. A lymph node on series 2, image  99 measures 12 mm. No other adenopathy identified. The chest wall is normal. Lungs/Pleura: The trachea and mainstem bronchi are normal. There is occlusion of a right lower lobe bronchus with abrupt cut off best seen on coronal image 75. There is a masslike opacity in the right base measuring 7.6 x 5.5 x 5.8 cm in transverse, AP, and craniocaudal dimensions. The mass extends towards the hilum with apparent in case mint of a right lower lobe bronchus seen on coronal image 76 and axial image 106. There is a tiny 3 or 4 mm nodule in the periphery of the right lung on series 5, image 80. Another nodule is seen in the lingula on series 5, image 100 measuring 7 mm. No other nodules or masses. Emphysematous changes are seen. No evidence of pneumonia. Upper Abdomen: No acute abnormality. Musculoskeletal: Anterior wedging of L1 better appreciated on the recent CT scan, age indeterminate. No evidence of bony metastatic disease. IMPRESSION: 1. The findings are most consistent with a a right lower lobe primary malignancy extending towards the right hilum with encasement of a right lower lobe bronchus. The adenopathy in the right infrahilar, hilar, and subcarinal regions is concerning for metastatic disease. 2. There is a 3 or 4 mm nodule in the periphery of the right lung on series 5, image 80. A 7 mm nodule seen in the lingula, possibly focal atelectasis but nonspecific. Recommend attention on follow-up. 3. Three-vessel calcified coronary artery disease. 4. Mild cardiomegaly. 5. Calcified atherosclerosis in the nonaneurysmal aorta. 6. Thyroid nodules as above. The largest measures 11 mm. Not clinically significant; no follow-up imaging recommended (ref: J Am Coll Radiol. 2015 Feb;12(2): 143-50). 7. Emphysema. 8. Age-indeterminate anterior wedging of L1 also described on the CT of the abdomen and pelvis from yesterday. Aortic Atherosclerosis (ICD10-I70.0) and Emphysema (ICD10-J43.9). Electronically Signed   By: Dorise Bullion  III M.D   On: 07/18/2020 12:53   CT ABDOMEN PELVIS W CONTRAST  Result Date: 07/17/2020 CLINICAL DATA:  80 year old female with diarrhea, increasing weakness and dementia. EXAM: CT ABDOMEN AND PELVIS WITH CONTRAST TECHNIQUE: Multidetector CT imaging of the abdomen and pelvis was performed using the standard protocol following bolus administration of intravenous contrast. CONTRAST:  65mL OMNIPAQUE IOHEXOL 300 MG/ML  SOLN COMPARISON:  01/17/2014 CT FINDINGS: Lower chest: A new 7 x 7 x 4.5 cm posterior RIGHT LOWER lobe consolidation/mass is identified with enlarged RIGHT hilar lymph nodes, highly suspicious for malignancy. No pleural effusions are identified. Hepatobiliary: Small hepatic cysts are again noted. No new hepatic abnormalities are identified. The gallbladder Cholelithiasis identified without CT evidence of acute cholecystitis. Pancreas: Unchanged pancreatic ductal dilatation identified without pancreatic mass, stable since 2015. Spleen: Unchanged.  No definite abnormality. Adrenals/Urinary Tract: A 2 cm calculus within the  RIGHT renal pelvis to the UPJ is noted with mild to moderate RIGHT hydronephrosis. Smaller nonobstructing RIGHT renal calculi are noted. Bilateral renal cortical atrophy is present. The adrenal glands and bladder are unremarkable. Stomach/Bowel: Stomach is within normal limits. Appendix appears normal. No evidence of bowel wall thickening, distention, or inflammatory changes. Vascular/Lymphatic: Aortic atherosclerosis. No enlarged abdominal or pelvic lymph nodes. Reproductive: Status post hysterectomy. No adnexal masses. Other: No ascites, pneumoperitoneum or focal collection. Musculoskeletal: A 50% SUPERIOR endplate compression fracture of L1 is age indeterminate but new since 72. Moderate degenerative disc disease at L5-S1 again noted. IMPRESSION: 1. 7 x 7 x 4.5 cm RIGHT LOWER lobe consolidation/mass with enlarged RIGHT hilar lymph nodes, highly suspicious for malignancy. Infection  or other etiologies considered less likely. 2. 2 cm RIGHT renal pelvis calculus to the UPJ with mild to moderate RIGHT hydronephrosis. 3. Age indeterminate 50% SUPERIOR endplate compression fracture of L1, new since 2015. 4. Cholelithiasis without CT evidence of acute cholecystitis. 5. Aortic Atherosclerosis (ICD10-I70.0). Electronically Signed   By: Margarette Canada M.D.   On: 07/17/2020 15:40    Microbiology: Recent Results (from the past 240 hour(s))  Resp Panel by RT-PCR (Flu A&B, Covid) Nasopharyngeal Swab     Status: None   Collection Time: 07/16/20 10:15 PM   Specimen: Nasopharyngeal Swab; Nasopharyngeal(NP) swabs in vial transport medium  Result Value Ref Range Status   SARS Coronavirus 2 by RT PCR NEGATIVE NEGATIVE Final    Comment: (NOTE) SARS-CoV-2 target nucleic acids are NOT DETECTED.  The SARS-CoV-2 RNA is generally detectable in upper respiratory specimens during the acute phase of infection. The lowest concentration of SARS-CoV-2 viral copies this assay can detect is 138 copies/mL. A negative result does not preclude SARS-Cov-2 infection and should not be used as the sole basis for treatment or other patient management decisions. A negative result may occur with  improper specimen collection/handling, submission of specimen other than nasopharyngeal swab, presence of viral mutation(s) within the areas targeted by this assay, and inadequate number of viral copies(<138 copies/mL). A negative result must be combined with clinical observations, patient history, and epidemiological information. The expected result is Negative.  Fact Sheet for Patients:  EntrepreneurPulse.com.au  Fact Sheet for Healthcare Providers:  IncredibleEmployment.be  This test is no t yet approved or cleared by the [place] FDA and  has been authorized for detection and/or diagnosis of SARS-CoV-2 by FDA under an Emergency Use Authorization (EUA). This EUA will  remain  in effect (meaning this test can be used) for the duration of the COVID-19 declaration under Section 564(b)(1) of the Act, 21 U.S.C.section 360bbb-3(b)(1), unless the authorization is terminated  or revoked sooner.       Influenza A by PCR NEGATIVE NEGATIVE Final   Influenza B by PCR NEGATIVE NEGATIVE Final    Comment: (NOTE) The Xpert Xpress SARS-CoV-2/FLU/RSV plus assay is intended as an aid in the diagnosis of influenza from Nasopharyngeal swab specimens and should not be used as a sole basis for treatment. Nasal washings and aspirates are unacceptable for Xpert Xpress SARS-CoV-2/FLU/RSV testing.  Fact Sheet for Patients: EntrepreneurPulse.com.au  Fact Sheet for Healthcare Providers: IncredibleEmployment.be  This test is not yet approved or cleared by the [place] FDA and has been authorized for detection and/or diagnosis of SARS-CoV-2 by FDA under an Emergency Use Authorization (EUA). This EUA will remain in effect (meaning this test can be used) for the duration of the COVID-19 declaration under Section 564(b)(1) of the Act, 21 U.S.C. section 360bbb-3(b)(1),  unless the authorization is terminated or revoked.  Performed at [facility], [address], [place], [place] [address]   Urine culture     Status: Abnormal   Collection Time: 07/18/20  1:12 PM   Specimen: Urine, Clean Catch  Result Value Ref Range Status   Specimen Description   Final    URINE, CLEAN CATCH Performed at [facility], [place] [address] [place], [place] [address]    Special Requests   Final    NONE Performed at [facility], [place] [address] [place], [place] [address]    Culture (A)  Final    <10,000 COLONIES/mL INSIGNIFICANT GROWTH Performed at [place] [address], [place], [place] [address]    Report Status 07/19/2020 FINAL  Final  SARS CORONAVIRUS 2 (TAT 6-24 HRS)  Nasopharyngeal Nasopharyngeal Swab     Status: None   Collection Time: 07/21/20  4:55 PM   Specimen: Nasopharyngeal Swab  Result Value Ref Range Status   SARS Coronavirus 2 NEGATIVE NEGATIVE Final    Comment: (NOTE) SARS-CoV-2 target nucleic acids are NOT DETECTED.  The SARS-CoV-2 RNA is generally detectable in upper and lower respiratory specimens during the acute phase of infection. Negative results do not preclude SARS-CoV-2 infection, do not rule out co-infections with other pathogens, and should not be used as the sole basis for treatment or other patient management decisions. Negative results must be combined with clinical observations, patient history, and epidemiological information. The expected result is Negative.  Fact Sheet for Patients: SugarRoll.be  Fact Sheet for Healthcare Providers: https://www.woods-mathews.com/  This test is not yet approved or cleared by the [place] FDA and  has been authorized for detection and/or diagnosis of SARS-CoV-2 by FDA under an Emergency Use Authorization (EUA). This EUA will remain  in effect (meaning this test can be used) for the duration of the COVID-19 declaration under Se ction 564(b)(1) of the Act, 21 U.S.C. section 360bbb-3(b)(1), unless the authorization is terminated or revoked sooner.  Performed at [facility], [place] [address] [place], [place] [address]      Labs: CBC: Recent Labs  Lab 07/16/20 2215 07/18/20 0428 07/19/20 0414 07/20/20 0344 07/21/20 0332  WBC 11.5* 10.9* 12.8* 11.1* 11.0*  NEUTROABS 8.1* 7.3 8.0* 7.8* 8.2*  HGB 4.8* 8.0* 9.4* 8.5* 8.8*  HCT 19.9* 30.2* 35.3* 31.6* 34.4*  MCV 56.7* 64.8* 64.1* 63.6* 66.0*  PLT 698* 625* 611* 513* 056*   Basic Metabolic Panel: Recent Labs  Lab 07/16/20 2215 07/18/20 0428 07/19/20 0414 07/20/20 0344 07/21/20 0332  NA 142 142 138 139 140  K 4.3 4.2 4.1 4.2 4.2  CL 109 110 105 109 108  CO2 25 28 25  22 23   GLUCOSE 99 91 106* 95 97  BUN 13 13 10 14  25*  CREATININE 0.79 0.77 0.68 0.70 0.80  CALCIUM 9.4 9.5 9.7 9.3 9.8   Liver Function Tests: Recent Labs  Lab 07/16/20 2215  AST 12*  ALT 7  ALKPHOS 67  BILITOT 0.3  PROT 6.6  ALBUMIN 3.5   CBG: Recent Labs  Lab 07/20/20 1804  GLUCAP 95    Time spent: 35 minutes  Signed:  Berle Mull  [facility]  07/22/2020 11:51 AM

## 2020-07-22 NOTE — NC FL2 (Addendum)
[place] LEVEL OF CARE SCREENING TOOL     IDENTIFICATION  Patient Name: Audrey Norris Birthdate: Jul 18, 1940 Sex: female Admission Date (Current Location): 07/16/2020  [facility] and [place] Number:  Herbalist and Address:  [facility],  [place], [place]      Provider Number: [id number]  Attending Physician Name and Address:  Lavina Hamman, MD  Relative Name and Phone Number:  Nadara Mustard Niece [phone], Ninfa Meeker Niece [phone]    Current Level of Care: Hospital Recommended Level of Care: [place] ([place]) Prior Approval Number:    Date Approved/Denied:   PASRR Number:    Discharge Plan: Other (Comment) (ALF Memory Care)    Current Diagnoses: Patient Active Problem List   Diagnosis Date Noted  . 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 Adenopathy   . Anemia 07/17/2020  . Symptomatic anemia 07/16/2020  . Depression with anxiety 05/05/2015  . Alzheimer's disease ([place]) 11/04/2014  . Vitamin D deficiency 11/02/2014  . Memory loss 09/23/2014  . Dementia ([place]) 09/23/2014  . Weight loss, abnormal 09/23/2014    Orientation RESPIRATION BLADDER Height & Weight     Self  Normal Incontinent Weight: 55 kg Height:  5\' 7"  (170.2 cm)  BEHAVIORAL SYMPTOMS/MOOD NEUROLOGICAL BOWEL NUTRITION STATUS      Incontinent Diet (Regular)  AMBULATORY STATUS COMMUNICATION OF NEEDS Skin   Limited Assist Verbally Normal                       Personal Care Assistance Level of Assistance  Bathing,Feeding,Dressing Bathing Assistance: Maximum assistance Feeding assistance: Limited assistance Dressing Assistance: Maximum assistance     Functional Limitations Info  Sight,Hearing,Speech Sight Info: Impaired Hearing Info: Adequate Speech Info: Adequate    SPECIAL CARE FACTORS FREQUENCY  PT (By licensed PT),OT (By licensed OT)     PT Frequency: Eval and Treat OT Frequency: Eval and  Treat            Contractures Contractures Info: Not present    Additional Factors Info  Code Status,Allergies Code Status Info: DNR Allergies Info: No Known Allergies           Current Medications (07/22/2020):  This is the current hospital active medication list Current Facility-Administered Medications  Medication Dose Route Frequency Provider Last Rate Last Admin  . acetaminophen (TYLENOL) tablet 500 mg  500 mg Oral Q6H PRN Alma Friendly, MD   500 mg at 07/21/20 0949  . divalproex (DEPAKOTE SPRINKLE) capsule 125 mg  125 mg Oral BID Alma Friendly, MD   125 mg at 07/21/20 2107  . ferrous sulfate tablet 325 mg  325 mg Oral Q breakfast Alma Friendly, MD   325 mg at 07/21/20 0949  . lip balm (CARMEX) ointment   Topical PRN Alma Friendly, MD      . loperamide (IMODIUM) capsule 2 mg  2 mg Oral PRN Alma Friendly, MD      . LORazepam (ATIVAN) injection 1 mg  1 mg Intravenous Q6H PRN Alma Friendly, MD   1 mg at 07/21/20 2318  . risperiDONE (RISPERDAL) tablet 1 mg  1 mg Oral BID Lavina Hamman, MD   1 mg at 07/21/20 2106  . senna-docusate (Senokot-S) tablet 1 tablet  1 tablet Oral QHS Alma Friendly, MD   1 tablet at 07/20/20 2111  . sertraline (ZOLOFT) tablet 50 mg  50 mg Oral Daily Alma Friendly,  MD   50 mg at 07/21/20 0949  . sucralfate (CARAFATE) tablet 1 g  1 g Oral TID Alma Friendly, MD   1 g at 07/21/20 1617     Discharge Medications: DISCHARGE MEDICATION: Allergies as of 07/22/2020   No Known Allergies        Medication List    STOP taking these medications   memantine 28 MG Cp24 24 hr capsule Commonly known as: NAMENDA XR   memantine 7 MG Cp24 24 hr capsule Commonly known as: NAMENDA XR     TAKE these medications   acetaminophen 500 MG tablet Commonly known as: TYLENOL Take 500 mg by mouth every 6 (six) hours as needed for mild pain, fever or headache.   aluminum-magnesium hydroxide-simethicone  681-157-26 MG/5ML Susp Commonly known as: MAALOX Take 30 mLs by mouth every 6 (six) hours as needed (indigestion/heartburn).   aspirin 81 MG chewable tablet Chew 81 mg by mouth daily.   cholecalciferol 1000 units tablet Commonly known as: VITAMIN D Take 1,000 Units by mouth daily.   divalproex 125 MG capsule Commonly known as: DEPAKOTE SPRINKLE Take 125 mg by mouth 2 (two) times daily.   donepezil 10 MG tablet Commonly known as: ARICEPT TAKE 1 TABLET BY MOUTH EVERY DAY   guaiFENesin 100 MG/5ML liquid Commonly known as: ROBITUSSIN Take 200 mg by mouth every 6 (six) hours as needed for cough.   loperamide 2 MG capsule Commonly known as: IMODIUM Take 2 mg by mouth as needed for diarrhea or loose stools.   magnesium hydroxide 400 MG/5ML suspension Commonly known as: MILK OF MAGNESIA Take 30 mLs by mouth at bedtime as needed for mild constipation.   neomycin-bacitracin-polymyxin Oint Commonly known as: NEOSPORIN Apply 1 application topically as needed for wound care.   potassium chloride 20 MEQ packet Commonly known as: KLOR-CON Take 40 mEq by mouth daily. Mix 2 packets with liquid   risperiDONE 0.5 MG tablet Commonly known as: RISPERDAL Take 0.5 mg by mouth 2 (two) times daily.   sertraline 50 MG tablet Commonly known as: ZOLOFT TAKE 1 TABLET BY MOUTH EVERY DAY   sucralfate 1 g tablet Commonly known as: CARAFATE Take 1 g by mouth 3 (three) times daily.      Relevant Imaging Results:  Relevant Lab Results:   Additional Information [phone], Niece Louis asked for [facility]/Hospice to follow pt at [facility].  Purcell Mouton, RN

## 2020-07-22 NOTE — TOC Progression Note (Signed)
Transition of Care [facility]) - Progression Note    Patient Details  Name: Audrey Norris MRN: [id number] Date of Birth: Jun 04, 1940  Transition of Care [facility]) CM/SW Contact  Purcell Mouton, RN Phone Number: 07/22/2020, 11:07 AM  Clinical Narrative:    Spoke with pt's niece Louis concerning discharge plans. Louis also asked for Hospice to follow pt at [facility] and selected [facility]. Referral given to in house rep with [facility].    Expected Discharge Plan: Assisted Living Barriers to Discharge: No Barriers Identified  Expected Discharge Plan and Services Expected Discharge Plan: Assisted Living   Discharge Planning Services: CM Consult   Living arrangements for the past 2 months: [place] (Memory)                                       Social Determinants of Health (SDOH) Interventions    Readmission Risk Interventions No flowsheet data found.

## 2020-07-22 NOTE — TOC Progression Note (Signed)
Transition of Care [facility]) - Progression Note    Patient Details  Name: Audrey Norris MRN: [id number] Date of Birth: Feb 05, 1941  Transition [facility] Care [facility]) CM/SW Contact  Purcell Mouton, RN Phone Number: 07/22/2020, 1:22 PM  Clinical Narrative:    Tawni Carnes to [facility] and called with no return call at present time.    Expected Discharge Plan: Assisted Living Barriers to Discharge: No Barriers Identified  Expected Discharge Plan and Services Expected Discharge Plan: Assisted Living   Discharge Planning Services: CM Consult   Living arrangements for the past 2 months: [place] (Memory) Expected Discharge Date: 07/22/20                                     Social Determinants of Health (SDOH) Interventions    Readmission Risk Interventions No flowsheet data found.

## 2020-07-22 NOTE — TOC Progression Note (Signed)
Transition of Care [facility]) - Progression Note    Patient Details  Name: Audrey Norris MRN: [id number] Date of Birth: 13-Aug-1940  Transition of Care [facility]) CM/SW Contact  Purcell Mouton, RN Phone Number: 07/22/2020, 3:11 PM  Clinical Narrative:    Spoke with Admission Coordinator Crystal  who states pt may return. [facility] was called.   Expected Discharge Plan: Assisted Living Barriers to Discharge: No Barriers Identified  Expected Discharge Plan and Services Expected Discharge Plan: Assisted Living   Discharge Planning Services: CM Consult   Living arrangements for the past 2 months: [place] (Memory) Expected Discharge Date: 07/22/20                                     Social Determinants of Health (SDOH) Interventions    Readmission Risk Interventions No flowsheet data found.

## 2020-07-22 NOTE — NC FL2 (Signed)
[place] [employer] LEVEL OF CARE SCREENING TOOL     IDENTIFICATION  Patient Name: Audrey Norris Birthdate: [birth year] Sex: female Admission Date (Current Location): 07/16/2020  [facility] and [place] Number:  Herbalist and Address:  [facility],  [place], [place]      Provider Number: [id number]  Attending Physician Name and Address:  Lavina Hamman, MD  Relative Name and Phone Number:  Nadara Mustard Niece [phone], Ninfa Meeker Niece [phone]    Current Level of Care: Hospital Recommended Level of Care: Memory Care [facility]) Prior Approval Number:    Date Approved/Denied:   PASRR Number:    Discharge Plan: Other (Comment) (ALF Memory Care)    Current Diagnoses: Patient Active Problem List   Diagnosis Date Noted  . 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 Adenopathy   . Anemia 07/17/2020  . Symptomatic anemia 07/16/2020  . Depression with anxiety 05/05/2015  . Alzheimer's disease ([place]) 11/04/2014  . Vitamin D deficiency 11/02/2014  . Memory loss 09/23/2014  . Dementia ([place]) 09/23/2014  . Weight loss, abnormal 09/23/2014    Orientation RESPIRATION BLADDER Height & Weight     Self  Normal Incontinent Weight: 55 kg Height:  5\' 7"  (170.2 cm)  BEHAVIORAL SYMPTOMS/MOOD NEUROLOGICAL BOWEL NUTRITION STATUS      Incontinent Diet (Regular Chopped Meat)  AMBULATORY STATUS COMMUNICATION OF NEEDS Skin   Limited Assist Verbally Normal                       Personal Care Assistance Level of Assistance  Bathing,Feeding,Dressing Bathing Assistance: Maximum assistance Feeding assistance: Limited assistance Dressing Assistance: Maximum assistance     Functional Limitations Info  Sight,Hearing,Speech Sight Info: Impaired Hearing Info: Adequate Speech Info: Adequate    SPECIAL CARE FACTORS FREQUENCY  PT (By licensed PT),OT (By licensed OT)     PT Frequency: Eval and Treat OT Frequency: Eval and  Treat            Contractures Contractures Info: Not present    Additional Factors Info  Code Status,Allergies Code Status Info: DNR Allergies Info: No Known Allergies           Current Medications (07/22/2020):  This is the current hospital active medication list Current Facility-Administered Medications  Medication Dose Route Frequency Provider Last Rate Last Admin  . acetaminophen (TYLENOL) tablet 500 mg  500 mg Oral Q6H PRN Alma Friendly, MD   500 mg at 07/21/20 0949  . divalproex (DEPAKOTE SPRINKLE) capsule 125 mg  125 mg Oral BID Alma Friendly, MD   125 mg at 07/21/20 2107  . ferrous sulfate tablet 325 mg  325 mg Oral Q breakfast Alma Friendly, MD   325 mg at 07/21/20 0949  . lip balm (CARMEX) ointment   Topical PRN Alma Friendly, MD      . loperamide (IMODIUM) capsule 2 mg  2 mg Oral PRN Alma Friendly, MD      . LORazepam (ATIVAN) injection 1 mg  1 mg Intravenous Q6H PRN Alma Friendly, MD   1 mg at 07/21/20 2318  . risperiDONE (RISPERDAL) tablet 1 mg  1 mg Oral BID Lavina Hamman, MD   1 mg at 07/21/20 2106  . senna-docusate (Senokot-S) tablet 1 tablet  1 tablet Oral QHS Alma Friendly, MD   1 tablet at 07/20/20 2111  . sertraline (ZOLOFT) tablet 50 mg  50 mg Oral Daily [place],  Adline Peals, MD   50 mg at 07/21/20 0949  . sucralfate (CARAFATE) tablet 1 g  1 g Oral TID Alma Friendly, MD   1 g at 07/21/20 1617     Discharge Medications: Please see discharge summary for a list of discharge medications.  Relevant Imaging Results:  Relevant Lab Results:   Additional Information [phone], Niece Louis asked for [facility]/Hospice to follow pt at [facility].  Purcell Mouton, RN

## 2020-07-22 NOTE — Progress Notes (Signed)
Manufacturing engineer (ACC)  Received request from [facility] for hospice services at [facility] after discharge.  Chart and pt information under review by [facility] physician.  Hospice eligibility pending at this time.  Due to receiving this referral so close to time of discharge ACC will follow this patient as a community referral.  [place] send signed and completed DNR home with pt/family.  Please provide prescriptions at discharge as needed to ensure ongoing symptom management until pt can be admitted onto hospice services.     Thank you for the opportunity to participate in this pt's care.  Domenic Moras, BSN, RN [employer] [phone] (24h on call)

## 2020-07-22 NOTE — Plan of Care (Signed)

## 2020-07-29 ENCOUNTER — Other Ambulatory Visit: Payer: Self-pay | Admitting: *Deleted

## 2020-07-29 NOTE — Progress Notes (Signed)
The proposed treatment discussed in cancer conference is for discussion purpose only and is not a binding recommendation. The patient was not physically examined nor present for their treatment options. Therefore, final treatment plans cannot be decided.  ?

## 2020-08-02 ENCOUNTER — Encounter: Payer: Self-pay | Admitting: *Deleted

## 2020-08-02 ENCOUNTER — Telehealth: Payer: Self-pay | Admitting: *Deleted

## 2020-08-02 DIAGNOSIS — R918 Other nonspecific abnormal finding of lung field: Secondary | ICD-10-CM

## 2020-08-02 NOTE — Telephone Encounter (Signed)
I called patient to schedule with Dr. Julien Nordmann. Patient is on Hospice care and does not need to see Dr. Julien Nordmann.

## 2020-08-02 NOTE — Progress Notes (Signed)
Patients case was discussed at last weeks cancer conference.  I followed up to see if patient has referral to oncology and she did not. I notified Dr. Valeta Harms. He would like referral to Dr. Julien Nordmann. Referral completed.

## 2020-12-18 DEATH — deceased

## 2021-06-01 IMAGING — CT CT CHEST W/ CM
2 of 4 series · 14 of 36 positions shown, 17 images · IV contrast (omnipaque)
Comparison: CT the abdomen and pelvis July 17, 2020.

CLINICAL DATA: Right lower lobe consolidation versus mass with
enlarged right hilar lymph nodes.

EXAM:
CT CHEST WITH CONTRAST
TECHNIQUE: Multidetector CT imaging of the chest was performed during
intravenous contrast administration.
CONTRAST:  75mL OMNIPAQUE IOHEXOL 300 MG/ML  SOLN

[Series 2: axial st · axial · 0.64mm/px · z∈[+1382,+1658]mm · 11 of 164 slices shown, 14 images]
[im 13/164  mediastinal]
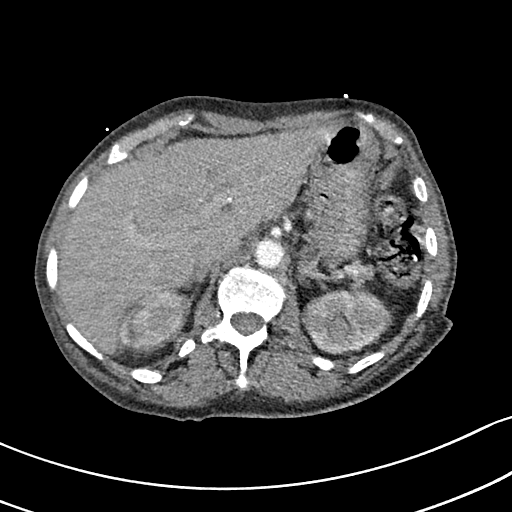
[im 13/164  lung]
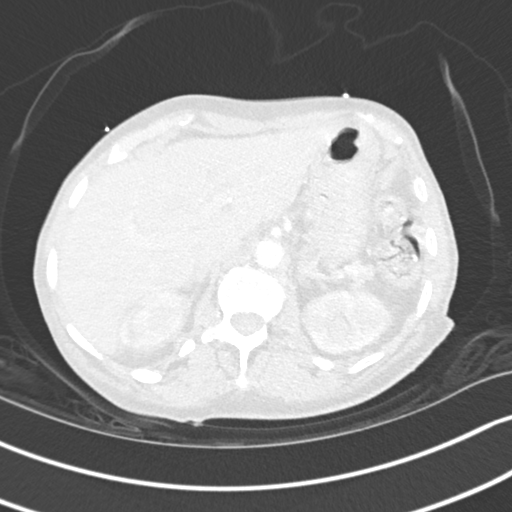
[im 26/164  lung]
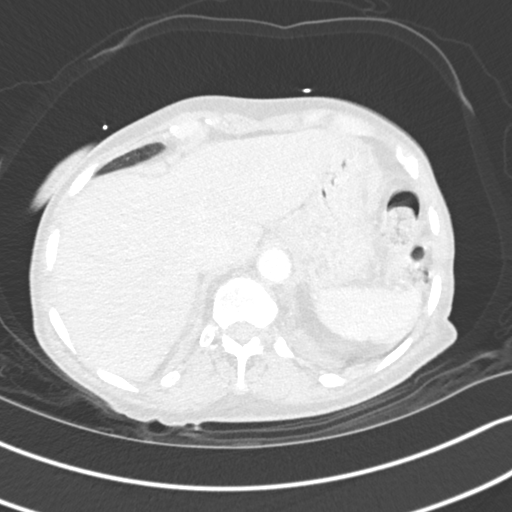
[im 38/164  lung]
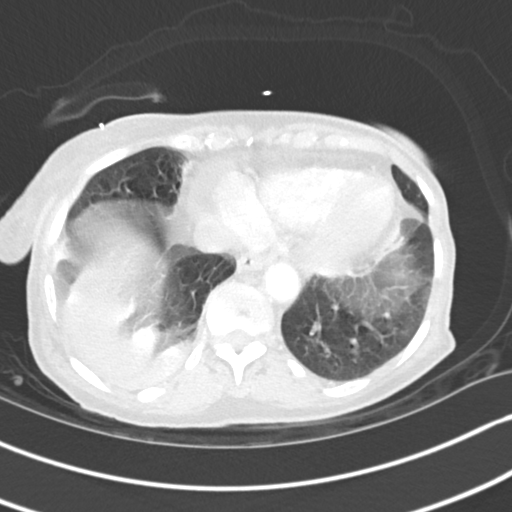
[im 51/164  lung]
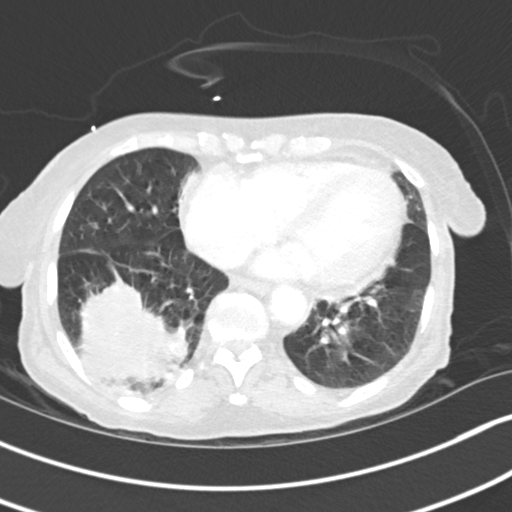
[im 63/164  mediastinal]
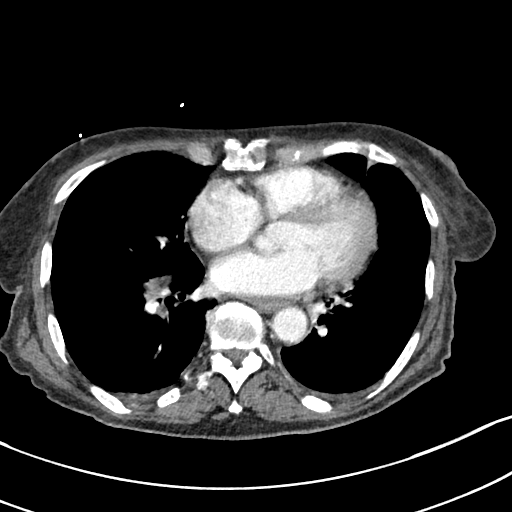
[im 63/164  lung]
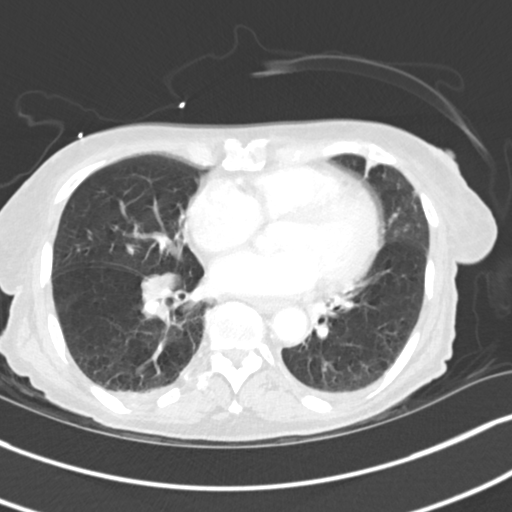
[im 88/164  lung]
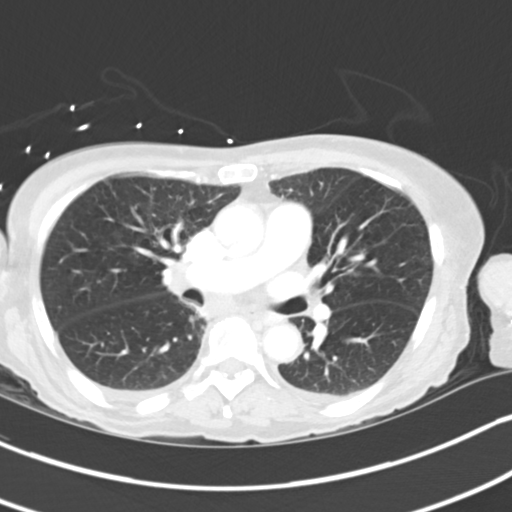
[im 101/164  lung]
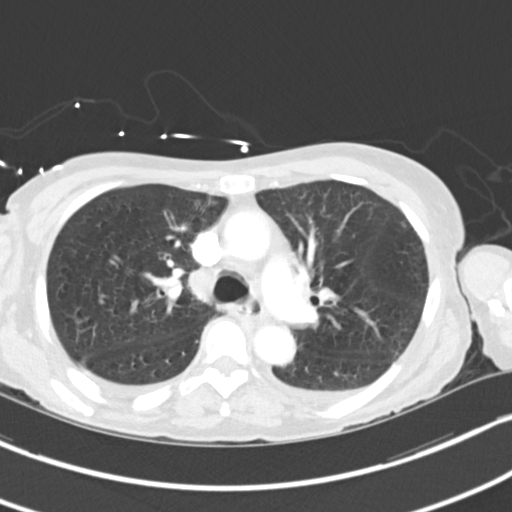
[im 113/164  lung]
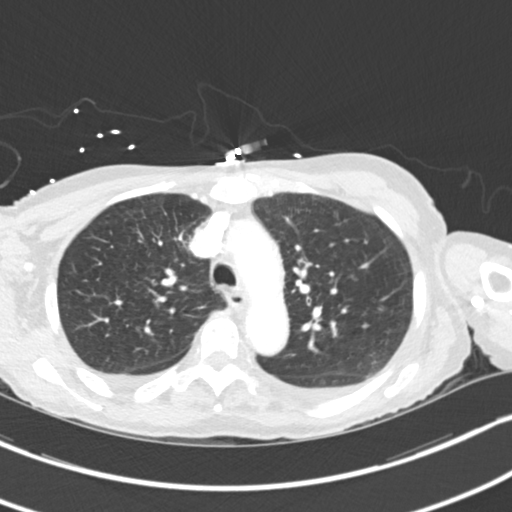
[im 126/164  mediastinal]
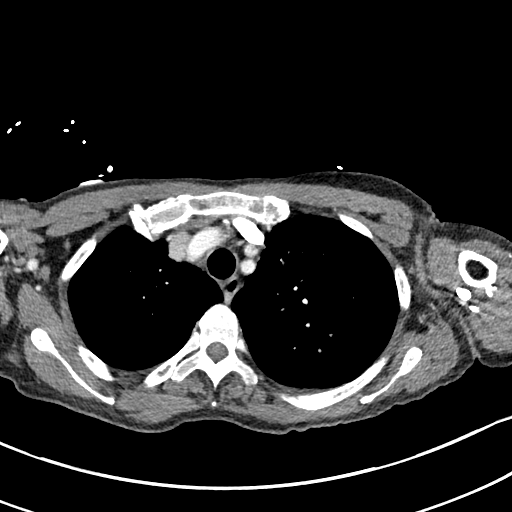
[im 126/164  lung]
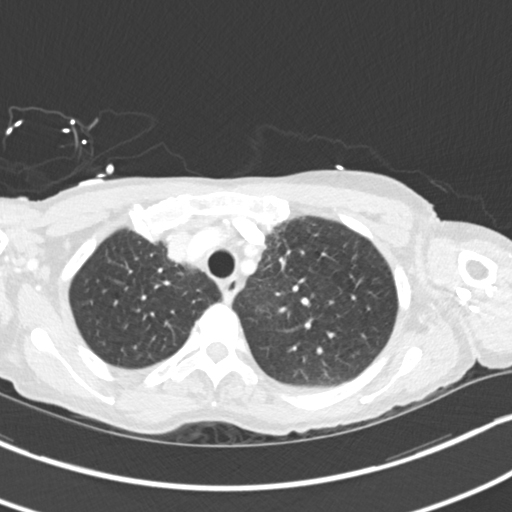
[im 138/164  lung]
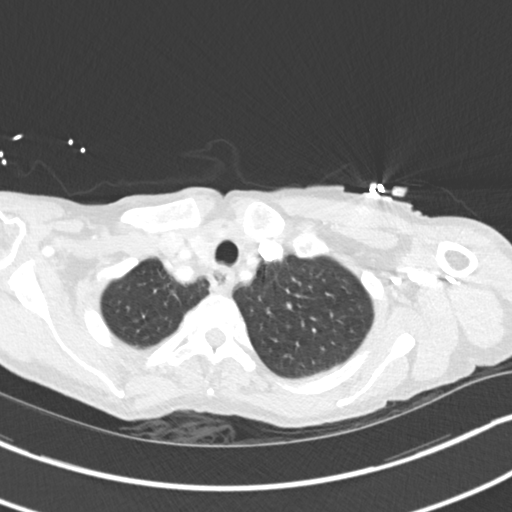
[im 151/164  lung]
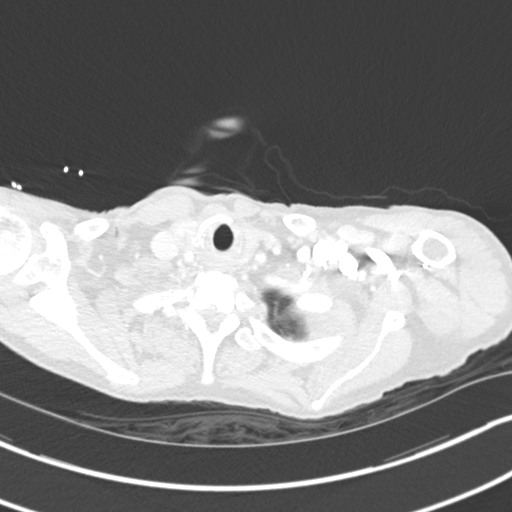

[Series 6: coronal · coronal · 0.57mm/px · 3 of 133 slices shown]
[im 27/133  lung]
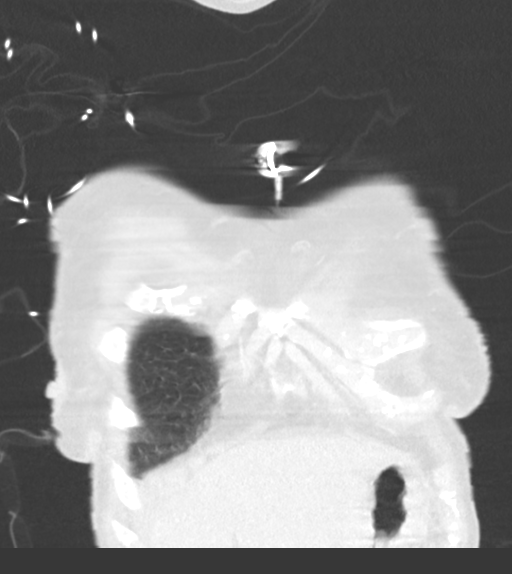
[im 53/133  lung]
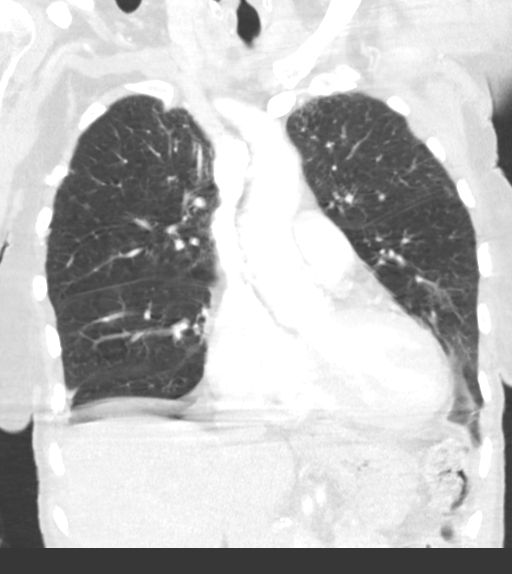
[im 80/133  lung]
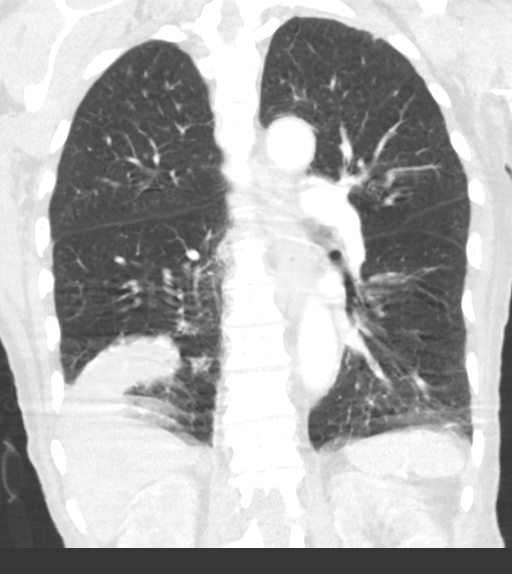

[14 of 36 positions shown; findings below may reference images not displayed]

FINDINGS: Cardiovascular: Three-vessel calcified coronary artery disease. Mild
cardiomegaly. Calcified atherosclerosis in the nonaneurysmal
thoracic aorta. Central pulmonary arteries are normal.

Mediastinum/Nodes: Several small nodules are seen in the thyroid
lobes bilaterally. The largest measures 11 mm. The esophagus is
normal. Tiny pleural effusions. No pericardial effusion. There is an
enlarged lymph node in the subcarinal region, adjacent to the right
mainstem bronchus measuring 1.4 cm on series 2, image 79. There is
adenopathy in the right hilar and infrahilar region as well. A lymph
node on series 2, image 99 measures 12 mm. No other adenopathy
identified. The chest wall is normal.

Lungs/Pleura: The trachea and mainstem bronchi are normal. There is
occlusion of a right lower lobe bronchus with abrupt cut off best
seen on coronal image 75. There is a masslike opacity in the right
base measuring 7.6 x 5.5 x 5.8 cm in transverse, AP, and
craniocaudal dimensions. The mass extends towards the hilum with
apparent in case mint of a right lower lobe bronchus seen on coronal
image 76 and axial image 106. There is a tiny 3 or 4 mm nodule in
the periphery of the right lung on series 5, image 80. Another
nodule is seen in the lingula on series 5, image 100 measuring 7 mm.
No other nodules or masses. Emphysematous changes are seen. No
evidence of pneumonia.

Upper Abdomen: No acute abnormality.

Musculoskeletal: Anterior wedging of L1 better appreciated on the
recent CT scan, age indeterminate. No evidence of bony metastatic
disease.
IMPRESSION: 1. The findings are most consistent with a a right lower lobe
primary malignancy extending towards the right hilum with encasement
of a right lower lobe bronchus. The adenopathy in the right
infrahilar, hilar, and subcarinal regions is concerning for
metastatic disease.
2. There is a 3 or 4 mm nodule in the periphery of the right lung on
series 5, image 80. A 7 mm nodule seen in the lingula, possibly
focal atelectasis but nonspecific. Recommend attention on follow-up.
3. Three-vessel calcified coronary artery disease.
4. Mild cardiomegaly.
5. Calcified atherosclerosis in the nonaneurysmal aorta.
6. Thyroid nodules as above. The largest measures 11 mm. Not
clinically significant; no follow-up imaging recommended (ref: [HOSPITAL]. [DATE]): 143-50).
7. Emphysema.
8. Age-indeterminate anterior wedging of L1 also described on the CT
of the abdomen and pelvis from yesterday.

Aortic Atherosclerosis (NCGXQ-29D.D) and Emphysema (NCGXQ-TUL.3).
